# Patient Record
Sex: Female | Born: 1990 | Race: Black or African American | Hispanic: No | Marital: Single | Smoking: Never smoker
Health system: Southern US, Community
[De-identification: ages and names within clinical notes are randomized; demographics above are authoritative.]

## PROBLEM LIST (undated history)

## (undated) ENCOUNTER — Inpatient Hospital Stay (HOSPITAL_COMMUNITY): Payer: Self-pay

## (undated) DIAGNOSIS — B009 Herpesviral infection, unspecified: Secondary | ICD-10-CM

## (undated) DIAGNOSIS — N760 Acute vaginitis: Secondary | ICD-10-CM

## (undated) DIAGNOSIS — D649 Anemia, unspecified: Secondary | ICD-10-CM

## (undated) DIAGNOSIS — B999 Unspecified infectious disease: Secondary | ICD-10-CM

## (undated) DIAGNOSIS — F419 Anxiety disorder, unspecified: Secondary | ICD-10-CM

## (undated) DIAGNOSIS — N898 Other specified noninflammatory disorders of vagina: Principal | ICD-10-CM

## (undated) DIAGNOSIS — B9689 Other specified bacterial agents as the cause of diseases classified elsewhere: Secondary | ICD-10-CM

## (undated) DIAGNOSIS — A749 Chlamydial infection, unspecified: Secondary | ICD-10-CM

## (undated) DIAGNOSIS — Z889 Allergy status to unspecified drugs, medicaments and biological substances status: Secondary | ICD-10-CM

## (undated) DIAGNOSIS — L509 Urticaria, unspecified: Secondary | ICD-10-CM

## (undated) HISTORY — DX: Allergy status to unspecified drugs, medicaments and biological substances: Z88.9

## (undated) HISTORY — DX: Urticaria, unspecified: L50.9

## (undated) HISTORY — DX: Acute vaginitis: N76.0

## (undated) HISTORY — PX: NO PAST SURGERIES: SHX2092

## (undated) HISTORY — DX: Other specified noninflammatory disorders of vagina: N89.8

## (undated) HISTORY — DX: Herpesviral infection, unspecified: B00.9

## (undated) HISTORY — DX: Chlamydial infection, unspecified: A74.9

## (undated) HISTORY — DX: Other specified bacterial agents as the cause of diseases classified elsewhere: B96.89

---
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
## 2010-09-04 ENCOUNTER — Emergency Department (HOSPITAL_COMMUNITY)
Admission: EM | Admit: 2010-09-04 | Discharge: 2010-09-04 | Disposition: A | Discharge status: Home / Self Care | Payer: 59 | Attending: Emergency Medicine | Admitting: Emergency Medicine

## 2010-09-04 DIAGNOSIS — K5289 Other specified noninfective gastroenteritis and colitis: Secondary | ICD-10-CM | POA: Insufficient documentation

## 2014-01-13 ENCOUNTER — Emergency Department (HOSPITAL_COMMUNITY): Payer: BC Managed Care – PPO

## 2014-01-13 ENCOUNTER — Emergency Department (HOSPITAL_COMMUNITY)
Admission: EM | Admit: 2014-01-13 | Discharge: 2014-01-13 | Disposition: A | Discharge status: Home / Self Care | Payer: BC Managed Care – PPO | Attending: Emergency Medicine | Admitting: Emergency Medicine

## 2014-01-13 ENCOUNTER — Encounter (HOSPITAL_COMMUNITY): Payer: Self-pay | Admitting: Emergency Medicine

## 2014-01-13 DIAGNOSIS — Z3202 Encounter for pregnancy test, result negative: Secondary | ICD-10-CM | POA: Diagnosis not present

## 2014-01-13 DIAGNOSIS — R071 Chest pain on breathing: Secondary | ICD-10-CM | POA: Diagnosis not present

## 2014-01-13 DIAGNOSIS — R079 Chest pain, unspecified: Secondary | ICD-10-CM | POA: Diagnosis present

## 2014-01-13 DIAGNOSIS — R0789 Other chest pain: Secondary | ICD-10-CM

## 2014-01-13 LAB — BASIC METABOLIC PANEL
ANION GAP: 12 (ref 5–15)
BUN: 9 mg/dL (ref 6–23)
CO2: 25 meq/L (ref 19–32)
Calcium: 8.8 mg/dL (ref 8.4–10.5)
Chloride: 102 mEq/L (ref 96–112)
Creatinine, Ser: 0.67 mg/dL (ref 0.50–1.10)
GFR calc Af Amer: >90 mL/min (ref >90)
GFR calc non Af Amer: >90 mL/min (ref >90)
Glucose, Bld: 95 mg/dL (ref 70–99)
POTASSIUM: 3.8 meq/L (ref 3.7–5.3)
SODIUM: 139 meq/L (ref 137–147)

## 2014-01-13 LAB — URINALYSIS, ROUTINE W REFLEX MICROSCOPIC
Bilirubin Urine: NEGATIVE
Glucose, UA: NEGATIVE mg/dL
Ketones, ur: NEGATIVE mg/dL
LEUKOCYTES UA: NEGATIVE
Nitrite: NEGATIVE
Protein, ur: NEGATIVE mg/dL
Specific Gravity, Urine: <1.005 — ABNORMAL LOW (ref 1.005–1.030)
UROBILINOGEN UA: 0.2 mg/dL (ref 0.0–1.0)
pH: 6 (ref 5.0–8.0)

## 2014-01-13 LAB — LIPASE, BLOOD: Lipase: 28 U/L (ref 11–59)

## 2014-01-13 LAB — CBC
HCT: 35.3 % — ABNORMAL LOW (ref 36.0–46.0)
Hemoglobin: 11.8 g/dL — ABNORMAL LOW (ref 12.0–15.0)
MCH: 27.1 pg (ref 26.0–34.0)
MCHC: 33.4 g/dL (ref 30.0–36.0)
MCV: 81 fL (ref 78.0–100.0)
Platelets: 326 10*3/uL (ref 150–400)
RBC: 4.36 MIL/uL (ref 3.87–5.11)
RDW: 13.1 % (ref 11.5–15.5)
WBC: 11.4 10*3/uL — ABNORMAL HIGH (ref 4.0–10.5)

## 2014-01-13 LAB — HEPATIC FUNCTION PANEL
ALK PHOS: 59 U/L (ref 39–117)
ALT: 9 U/L (ref 0–35)
AST: 16 U/L (ref 0–37)
Albumin: 4 g/dL (ref 3.5–5.2)
Bilirubin, Direct: <0.2 mg/dL (ref 0.0–0.3)
TOTAL PROTEIN: 7.6 g/dL (ref 6.0–8.3)
Total Bilirubin: 0.5 mg/dL (ref 0.3–1.2)

## 2014-01-13 LAB — PREGNANCY, URINE: Preg Test, Ur: NEGATIVE

## 2014-01-13 LAB — URINE MICROSCOPIC-ADD ON

## 2014-01-13 LAB — TROPONIN I

## 2014-01-13 MED ORDER — METHOCARBAMOL 500 MG PO TABS: 1000.0000 mg | ORAL_TABLET | Freq: Four times a day (QID) | ORAL | Status: DC | PRN

## 2014-01-13 MED ORDER — HYDROCODONE-ACETAMINOPHEN 5-325 MG PO TABS: ORAL_TABLET | ORAL | Status: DC

## 2014-01-13 MED ORDER — IOHEXOL 350 MG/ML SOLN
100.0000 mL | Freq: Once | INTRAVENOUS | Status: AC | PRN
  Administered 2014-01-13: 100 mL via INTRAVENOUS

## 2014-01-13 MED ORDER — GI COCKTAIL ~~LOC~~
30.0000 mL | Freq: Once | ORAL | Status: AC
  Administered 2014-01-13: 30 mL via ORAL
  Filled 2014-01-13: qty 30

## 2014-01-13 MED ORDER — OXYCODONE-ACETAMINOPHEN 5-325 MG PO TABS: 1.0000 | ORAL_TABLET | Freq: Once | ORAL | Status: DC

## 2014-01-13 MED ORDER — IBUPROFEN 400 MG PO TABS
400.0000 mg | ORAL_TABLET | Freq: Once | ORAL | Status: AC
  Administered 2014-01-13: 400 mg via ORAL
  Filled 2014-01-13: qty 1

## 2014-01-13 MED ORDER — NAPROXEN 250 MG PO TABS: 250.0000 mg | ORAL_TABLET | Freq: Two times a day (BID) | ORAL | Status: DC

## 2014-01-13 NOTE — ED Notes (Signed)
Pt reports chest pain since last night. Pt reports pain is worse with a deep breath. Pt reports n/v. nad noted.

## 2014-01-13 NOTE — ED Notes (Signed)
Pt did not want anything to make her sleepy so refused percocet.

## 2014-01-13 NOTE — Discharge Instructions (Signed)
°Emergency Department Resource Guide °1) Find a Doctor and Pay Out of Pocket °Although you won't have to find out who is covered by your insurance plan, it is a good idea to ask around and get recommendations. You will then need to call the office and see if the doctor you have chosen will accept you as a new patient and what types of options they offer for patients who are self-pay. Some doctors offer discounts or will set up payment plans for their patients who do not have insurance, but you will need to ask so you aren't surprised when you get to your appointment. ° °2) Contact Your Local Health Department °Not all health departments have doctors that can see patients for sick visits, but many do, so it is worth a call to see if yours does. If you don't know where your local health department is, you can check in your phone book. The CDC also has a tool to help you locate your state's health department, and many state websites also have listings of all of their local health departments. ° °3) Find a Walk-in Clinic °If your illness is not likely to be very severe or complicated, you may want to try a walk in clinic. These are popping up all over the country in pharmacies, drugstores, and shopping centers. They're usually staffed by nurse practitioners or physician assistants that have been trained to treat common illnesses and complaints. They're usually fairly quick and inexpensive. However, if you have serious medical issues or chronic medical problems, these are probably not your best option. ° °No Primary Care Doctor: °- Call [facility] at  832-8000 - they can help you locate a primary care doctor that  accepts your insurance, provides certain services, etc. °- Physician Referral Service- 1-800-533-3463 ° °Chronic Pain Problems: °Organization         Address  Phone   Notes  °[facility]  (336) 297-2271 Patients need to be referred by their primary care doctor.  ° °Medication  Assistance: °Organization         Address  Phone   Notes  °[place] [facility] [address] °[place], [place] [address] (336) 641-8030 --Must be a resident of [place] °-- Must have NO insurance coverage whatsoever (no Medicaid/ Medicare, etc.) °-- The pt. MUST have a primary care doctor that directs their care regularly and follows them in the community °  °[facility]  (866) 331-1348   °[employer]  (888) 892-1162   ° °Agencies that provide inexpensive medical care: °Organization         Address  Phone   Notes  °[facility]  (336) 832-8035   °[facility]    (336) 832-7272   °[facility] [address] °[place], [place] [address] (336) 832-4777   °[facility] [address], °[place] (336) 271-4999   °[facility]    (336) 373-0678   °[facility]    (336) 272-1050   °[facility] ° [address], [place] Phone:  (336) 832-4444, Fax:  (336) 832-4440 Hours of Operation:  9 am - 6 pm, M-F.  Also accepts Medicaid/Medicare and self-pay.  °[facility] ° [address], [place] Phone: (336) 832-3150, Fax: (336) 832-3151. Hours of Operation:  8:30 am - 5:30 pm, M-F.  Also accepts Medicaid and self-pay.  °[facility] [place] [address], [place] Phone: (336) 878-6027   °[facility] [address], [place], [place] (336)723-1848, Ext. 123 Mondays & Thursdays: 7-9 AM.  First 15 patients are seen on a first come, first serve basis. °  ° °Medicaid-accepting [place] Providers: ° °Organization         Address  Phone   Notes  °[facility] [address], [place] (336) 641-2100 Also accepts self-pay patients.  °[facility] [address], [place] ° (336) 856-9996   °[facility] [address], [place]  (336) 288-8857   °[facility] [address] [place] [address], [place] (336) 299-7000   °Veita Bland [address], [place]  ° (336) 373-1557 Only accepts [place] Medicaid patients after they have their name applied to their card.  ° °Self-Pay (no insurance) in [place]: ° °Organization         Address  Phone   Notes  °Sickle Cell Patients, [facility] [address], [place] (336) 832-1970   °[facility] [address], [place] (336) 832-4400   °[facility] ° [address], [place] (336) 992-4800   °[facility]/Dr. Osei-Bonsu ° [address] [place] [address], [place] or [address], [place] (336) 841-8500 Phone number for both [place] and [place] locations is the same.  °[facility] [address], [place] (336) 299-0000   °[facility] [address] [place] [address], [place] or [address] (336) 852-7530 °(336) 878-2260   °[facility] [address], [place] (336) 350-1642, phone; (336) 294-5005, fax Sees patients 1st and 3rd Saturday of every month.  Must not qualify for public or private insurance (i.e. Medicaid, Medicare, [place] Health Choice, Veterans' Benefits) • Household income should be no more than 200% of the poverty level •The clinic cannot treat you if you are pregnant or think you are pregnant • Sexually transmitted diseases are not treated at the clinic.  ° ° °Dental Care: °Organization         Address  Phone  Notes  °[place] [facility] [address], [place] (336) 641-6152 Accepts children up to age 21 who are enrolled in Medicaid or [place] Health Choice; pregnant women with a Medicaid card; and children who have applied for Medicaid or [place] Health Choice, but were declined, whose parents can pay a reduced fee at time of service.  °[place]  [facility] [place]  [address], [place] (336) 641-7733 Accepts children up to age 21 who are enrolled in Medicaid or [place] Health Choice; pregnant women with a Medicaid card; and children who have applied for Medicaid or [place] Health Choice, but were declined, whose parents can pay a reduced fee at time of service.  °[facility] ° [address],  (336) 641-4533 Patients are seen by appointment only. Walk-ins are not accepted. [facility] will see patients 18 years of age and older. °Monday - Tuesday (8am-5pm) °Most Wednesdays (8:30-5pm) °$30 per visit, cash only  °[facility] ° [address], [place] (336) 641-4533 Patients are seen by appointment only. Walk-ins are not accepted. [facility] will see patients 18 years of age and older. °One   Wednesday Evening (Monthly: Volunteer Based).  $30 per visit, cash only  °[facility]  (919) 537-3737 for adults; Children under age 4, call Graduate Pediatric Dentistry at (919) [phone]. Children aged 4-14, please call (919) [phone] to request a pediatric application. ° Dental services are provided in all areas of dental care including fillings, crowns and bridges, complete and partial dentures, implants, gum treatment, root canals, and extractions. Preventive care is also provided. Treatment is provided to both adults and children. °Patients are selected via a lottery and there is often a waiting list. °  °[facility] [address], °[place] ° (336) 763-8833 [email or web address] °  °[facility] [address], [place], [place] (336)723-1848, Ext. 123 Second and Fourth Thursday of each month, opens at 6:30 AM; Clinic ends at 9 AM.  Patients are seen on a first-come first-served basis, and a limited number are seen during each clinic.  ° °[facility] ° [address], [place], [place] (336) 723-7904    Eligibility Requirements °You must have lived in [place], [place], or [place] counties for at least the last three months. °  You cannot be eligible for state or federal sponsored healthcare insurance, including Veterans Administration, Medicaid, or Medicare. °  You generally cannot be eligible for healthcare insurance through your employer.  °  How to apply: °Eligibility screenings are held every Tuesday and Wednesday afternoon from 1:00 pm until 4:00 pm. You do not need an appointment for the interview!  °[facility] [address], [place], [place] 336-631-2330   °[facility]  336-342-8273   °[facility]  336-703-3100   °[facility]  336-570-6415   ° °Behavioral Health Resources in the Community: °Intensive Outpatient Programs °Organization         Address  Phone  Notes  °[facility] [address], [place], [place] 336-878-6098   °[facility] [address], [place], [place] 336-832-9800   °[facility] [address], [place], [place] ° 336-882-2125   °[facility] [address],  °[place], [place] 1-800-853-5163 or 336-641-4981   °Substance Abuse Resources °Organization         Address  Phone  Notes  °[facility]  336-882-2125   °[facility]  336-784-9470   °[facility]  336-285-9073   °[facility]  336-845-3988   °Residential & Outpatient Substance Abuse Program  1-800-659-3381   °Psychological Services °Organization         Address  Phone  Notes  °[facility]  336- 832-9600   °[facility]  336- 378-7881   °[facility] [address], [place] 1-800-853-5163 or 336-641-4981   ° °Mobile Crisis Teams °Organization         Address  Phone  Notes  °[facility], Mobile Crisis Care Unit  1-877-626-1772   °Assertive °[facility] ° [address]  [place], [place] 336-834-9664   °Sharon DeEsch [address] °[place] 336-554-5454   ° °Self-Help/Support Groups °Organization         Address  Phone             Notes  °[facility] - variety of support groups  336- 373-1402 Call for more information  °Narcotics Anonymous (NA), [facility] [address], °[place]   2 meetings at this location  ° °  Residential Treatment Programs °Organization         Address  Phone  Notes  °[facility] [address],    °[place]  1-866-801-8205   °[facility] ° [address], [place], [place] 704-293-8524   °[facility] [address], [place] 336-845-3988 Admissions: 8am-3pm M-F  °[facility] [address]    °[place], [place] 336-841-1104   °[facility] [address], [place], [place] 336-379-7146   °[facility] [address]  °[place], [place] 336-285-9073   °[facility] - Intensive Outpatient [address], [place], [place] 336-852-3033   °[facility] [address]  °[place], [place] 1-877-615-2722 or 336-784-9470   °[facility] [address] [place], [place] 336-227-7417 Accepts Medicaid  °[facility] [address]  °[place] 1-800-659-3381 Substance Abuse/Addiction Treatment  ° °[place] Behavioral Health Resources °Organization         Address  Phone  Notes  °[facility]  (888) 581-9988   °Julie Brannon, PhD [address] [place], [place]   (336) 349-5553 or (336) 951-0000   °[facility]   [address] °[place], [place] (336) 349-4454   °[facility] [address], [place], [place] (336) 342-8316 Insurance/Medicaid/sponsorship through [facility]  °[facility] [address]                                    [place], [place] (336) 342-8316 Therapy/tele-psych/case    °[facility] [address]  ° [place], [place] (336) 349-2233    °Dr. Arfeen  (336) 349-4544   °[facility] [place]  [employer] [place] [facility] 1) [address], [place] °2) [address], [place] °3)  [address], [place] (336) 349-3220 °(336) 342-7768 ° °(336) 342-8140   °[place] [facility] (336) 342-1394 or (336) 342-3537 (After Hours)    ° ° °Take the prescriptions as directed.  Apply moist heat or ice to the area(s) of discomfort, for 15 minutes at a time, several times per day for the next few days.  Do not fall asleep on a heating or ice pack.  Call your regular medical doctor tomorrow to schedule a follow up appointment this week.  Return to the Emergency Department immediately if worsening. ° °

## 2014-01-13 NOTE — ED Provider Notes (Signed)
CSN: [id number]     Arrival date & time 01/13/14  4098 History   First MD Initiated Contact with Patient 01/13/14 1014     Chief Complaint  Patient presents with  . Chest Pain      HPI Pt was seen at 1100. Per pt, c/o gradual onset and persistence of constant lower mid-sternal chest "pain" that began yesterday. Pt describes the pain as "constant pressure" which worsens with "taking a deep breath and talking." Has been associated with SOB. Pt states she "coughs and vomit comes up." Denies abd pain, no diarrhea, no back pain, no palpitations, no fevers, no recent illness, no rash.    History reviewed. No pertinent past medical history.  History reviewed. No pertinent past surgical history.  History  Substance Use Topics  . Smoking status: Never Smoker   . Smokeless tobacco: Not on file  . Alcohol Use: No    Review of Systems ROS: Statement: All systems negative except as marked or noted in the HPI; Constitutional: Negative for fever and chills. ; ; Eyes: Negative for eye pain, redness and discharge. ; ; ENMT: Negative for ear pain, hoarseness, nasal congestion, sinus pressure and sore throat. ; ; Cardiovascular: +CP, SOB. Negative for palpitations, diaphoresis, and peripheral edema. ; ; Respiratory: Negative for cough, wheezing and stridor. ; ; Gastrointestinal: +N/V. Negative for diarrhea, abdominal pain, blood in stool, hematemesis, jaundice and rectal bleeding. . ; ; Genitourinary: Negative for dysuria, flank pain and hematuria. ; ; Musculoskeletal: Negative for back pain and neck pain. Negative for swelling and trauma.; ; Skin: Negative for pruritus, rash, abrasions, blisters, bruising and skin lesion.; ; Neuro: Negative for headache, lightheadedness and neck stiffness. Negative for weakness, altered level of consciousness , altered mental status, extremity weakness, paresthesias, involuntary movement, seizure and syncope.      Allergies  Review of patient's allergies indicates no  known allergies.  Home Medications   Prior to Admission medications   Medication Sig Start Date End Date Taking? Authorizing Provider  ibuprofen (ADVIL,MOTRIN) 200 MG tablet Take 400 mg by mouth every 6 (six) hours as needed for moderate pain or cramping.   Yes Historical Provider, MD   BP 127/72  Pulse 80  Temp(Src) 98.9 F (37.2 C) (Oral)  Resp 14  Ht  (1.676 m)  Wt 138 lb (62.596 kg)  BMI 22.28 kg/m2  SpO2 100%  LMP 12/30/2013 Physical Exam 1105: Physical examination:  Nursing notes reviewed; Vital signs and O2 SAT reviewed;  Constitutional: Well developed, Well nourished, Well hydrated, In no acute distress; Head:  Normocephalic, atraumatic; Eyes: EOMI, PERRL, No scleral icterus; ENMT: Mouth and pharynx normal, Mucous membranes moist; Neck: Supple, Full range of motion, No lymphadenopathy; Cardiovascular: Regular rate and rhythm, No murmur, rub, or gallop; Respiratory: Breath sounds clear & equal bilaterally, No rales, rhonchi, wheezes.  Speaking full sentences with ease, Normal respiratory effort/excursion; Chest: +anterior chest wall tender to palp. No rash, no soft tissue crepitus, no deformity. Movement normal; Abdomen: Soft, Nontender, Nondistended, Normal bowel sounds; Genitourinary: No CVA tenderness; Extremities: Pulses normal, No tenderness, No edema, No calf edema or asymmetry.; Neuro: AA&Ox3, Major CN grossly intact.  Speech clear. No gross focal motor or sensory deficits in extremities.; Skin: Color normal, Warm, Dry.   ED Course  Procedures     EKG Interpretation   Date/Time:  Monday January 13 2014 09:28:00 EDT Ventricular Rate:  75 PR Interval:  136 QRS Duration: 78 QT Interval:  378 QTC Calculation: 422 R Axis:  77 Text Interpretation:  Normal sinus rhythm with sinus arrhythmia Normal ECG  No old tracing to compare Confirmed by [facility]  MD, Nicholos Johns [phone]) on  01/13/2014 10:14:37 AM      MDM  MDM Reviewed: previous chart, nursing note and  vitals Reviewed previous: labs and ECG Interpretation: labs, ECG, x-ray and CT scan    Results for orders placed during the hospital encounter of 01/13/14  CBC      Result Value Ref Range   WBC 11.4 (*) 4.0 - 10.5 K/uL   RBC 4.36  3.87 - 5.11 MIL/uL   Hemoglobin 11.8 (*) 12.0 - 15.0 g/dL   HCT 60.4 (*) 54.0 - 98.1 %   MCV 81.0  78.0 - 100.0 fL   MCH 27.1  26.0 - 34.0 pg   MCHC 33.4  30.0 - 36.0 g/dL   RDW 19.1  47.8 - 29.5 %   Platelets 326  150 - 400 K/uL  BASIC METABOLIC PANEL      Result Value Ref Range   Sodium 139  137 - 147 mEq/L   Potassium 3.8  3.7 - 5.3 mEq/L   Chloride 102  96 - 112 mEq/L   CO2 25  19 - 32 mEq/L   Glucose, Bld 95  70 - 99 mg/dL   BUN 9  6 - 23 mg/dL   Creatinine, Ser 6.21  0.50 - 1.10 mg/dL   Calcium 8.8  8.4 - 30.8 mg/dL   GFR calc non Af Amer >90  >90 mL/min   GFR calc Af Amer >90  >90 mL/min   Anion gap 12  5 - 15  TROPONIN I      Result Value Ref Range   Troponin I <0.30  <0.30 ng/mL  PREGNANCY, URINE      Result Value Ref Range   Preg Test, Ur NEGATIVE  NEGATIVE  URINALYSIS, ROUTINE W REFLEX MICROSCOPIC      Result Value Ref Range   Color, Urine YELLOW  YELLOW   APPearance CLEAR  CLEAR   Specific Gravity, Urine <1.005 (*) 1.005 - 1.030   pH 6.0  5.0 - 8.0   Glucose, UA NEGATIVE  NEGATIVE mg/dL   Hgb urine dipstick TRACE (*) NEGATIVE   Bilirubin Urine NEGATIVE  NEGATIVE   Ketones, ur NEGATIVE  NEGATIVE mg/dL   Protein, ur NEGATIVE  NEGATIVE mg/dL   Urobilinogen, UA 0.2  0.0 - 1.0 mg/dL   Nitrite NEGATIVE  NEGATIVE   Leukocytes, UA NEGATIVE  NEGATIVE  URINE MICROSCOPIC-ADD ON      Result Value Ref Range   Squamous Epithelial / LPF RARE  RARE   WBC, UA 0-2  <3 WBC/hpf   RBC / HPF 0-2  <3 RBC/hpf  LIPASE, BLOOD      Result Value Ref Range   Lipase 28  11 - 59 U/L  HEPATIC FUNCTION PANEL      Result Value Ref Range   Total Protein 7.6  6.0 - 8.3 g/dL   Albumin 4.0  3.5 - 5.2 g/dL   AST 16  0 - 37 U/L   ALT 9  0 - 35 U/L    Alkaline Phosphatase 59  39 - 117 U/L   Total Bilirubin 0.5  0.3 - 1.2 mg/dL   Bilirubin, Direct <6.5  0.0 - 0.3 mg/dL   Indirect Bilirubin NOT CALCULATED  0.3 - 0.9 mg/dL   Dg Chest 2 View 11/14/4694   CLINICAL DATA:  Chest pain  EXAM: CHEST  2 VIEW  COMPARISON:  None.  FINDINGS: Mildly enlarged cardiopericardial silhouette, cardiothoracic index 51%. No edema. The lungs appear clear. No pleural effusion.  IMPRESSION: 1. Mildly enlarged cardiopericardial silhouette but otherwise normal exam. Correlate with cardiac auscultation and ECG in determining the need for echocardiography.   Electronically Signed   By: Herbie Baltimore M.D.   On: 01/13/2014 10:29   Ct Angio Chest Pe W/cm &/or Wo Cm 01/13/2014   CLINICAL DATA:  Chest pain.  EXAM: CT ANGIOGRAPHY CHEST WITH CONTRAST  TECHNIQUE: Multidetector CT imaging of the chest was performed using the standard protocol during bolus administration of intravenous contrast. Multiplanar CT image reconstructions and MIPs were obtained to evaluate the vascular anatomy.  CONTRAST:  OMNIPAQUE IOHEXOL 350 MG/ML SOLN  COMPARISON:  Chest x-ray earlier today.  FINDINGS: The pulmonary arteries are adequately opacified. There is no evidence of pulmonary embolism. Lungs show no evidence of edema, infiltrate or nodule. No pleural or pericardial fluid is identified. No pneumothorax or pneumomediastinum. The visualized airways are normally patent.  No lymphadenopathy identified. The heart size is normal. The thoracic aorta is of normal caliber. The bony thorax is unremarkable. Visualized upper abdominal structures are within normal limits.  Review of the MIP images confirms the above findings.  IMPRESSION: Normal CTA of the chest. No evidence of pulmonary embolism or other acute findings.   Electronically Signed   By: Irish Lack M.D.   On: 01/13/2014 12:03    1315:  Normal heart size on CT scan. Testing reassuring. Will tx symptomatically at this time. Doubt PE as cause for  symptoms with normal CT-A chest.  Doubt ACS as cause for symptoms with normal troponin and unchanged EKG from previous after 2 days of constant symptoms. Pt states she wants to go home now. Pt is requesting a work note.  Dx and testing d/w pt and family.  Questions answered.  Verb understanding, agreeable to d/c home with outpt f/u.    Samuel Jester, DO 01/16/14 0003

## 2014-02-04 ENCOUNTER — Emergency Department (INDEPENDENT_AMBULATORY_CARE_PROVIDER_SITE_OTHER)
Admission: EM | Admit: 2014-02-04 | Discharge: 2014-02-04 | Disposition: A | Discharge status: Home / Self Care | Payer: BC Managed Care – PPO | Source: Home / Self Care | Attending: Family Medicine | Admitting: Family Medicine

## 2014-02-04 ENCOUNTER — Encounter (HOSPITAL_COMMUNITY): Payer: Self-pay | Admitting: Emergency Medicine

## 2014-02-04 DIAGNOSIS — L509 Urticaria, unspecified: Secondary | ICD-10-CM

## 2014-02-04 MED ORDER — PREDNISONE 5 MG PO KIT: PACK | ORAL | Status: DC

## 2014-02-04 NOTE — Discharge Instructions (Signed)
Thank you for coming in today. Take Zyrtec daily. You can also take Benadryl as needed. Take prednisone as directed for 12 days. Followup with a primary care provider.  Call or go to the emergency room if you get worse, have trouble breathing, have chest pains, or palpitations.  PRIMARY CARE Merchant navy officerDOCTORS [facility] [address]  [place], [place] [phone]  Fax [phone]  Nature conservation [facility] at [facility] [address]  [place], [place] [phone]  Fax [phone]  Nature conservation officer[facility]Bauer [facility] at [place] / [place] [id number]  [place], [place] [phone]  Fax [phone]  [facility] [address], [address]  [place], [place] [phone]  Fax [phone]  [facility] [address]  [place], [place] [phone]  Fax [phone]  [facility] [address] [place]  [place], [place] [phone]  Fax [phone]   [facility] [place] [address] [place] [address] Phone: [phone]   [facility] @ [facility] [address] [place] [address] Phone: [phone]   [facility] @ [place] [address] [place] [address] Phone: [phone]   [facility] @ [place] [address] [place] [address] Phone: [phone]   [facility] @ [place] [address] [place] [address] Phone: [phone] Fax: [phone]   [facility] @ [place] [address] [place] [address] Phone: [phone]   Dr. Maryelizabeth RowanElizabeth Dewey [address] [place] [address] [phone]   Hives Hives are itchy, red, swollen areas of the skin. They can vary in size and location on your body. Hives can  come and go for hours or several days (acute hives) or for several weeks (chronic hives). Hives do not spread from person to person (noncontagious). They may get worse with scratching, exercise, and emotional stress. CAUSES   Allergic reaction to food, additives, or drugs.  Infections, including the common cold.  Illness, such as vasculitis, lupus, or thyroid disease.  Exposure to sunlight, heat, or cold.  Exercise.  Stress.  Contact with chemicals. SYMPTOMS   Red or white swollen patches on the skin. The patches may change size, shape, and location quickly and repeatedly.  Itching.  Swelling of the hands, feet, and face. This may occur if hives develop deeper in the skin. DIAGNOSIS  Your caregiver can usually tell what is wrong by performing a physical exam. Skin or blood tests may also be done to determine the cause of your hives. In some cases, the cause cannot be determined. TREATMENT  Mild cases usually get better with medicines such as antihistamines. Severe cases may require an emergency epinephrine injection. If the cause of your hives is known, treatment includes avoiding that trigger.  HOME CARE INSTRUCTIONS   Avoid causes that trigger your hives.  Take antihistamines as directed by your caregiver to reduce the severity of your hives. Non-sedating or low-sedating antihistamines are usually recommended. Do not drive while taking an antihistamine.  Take any other medicines prescribed for itching as directed by your caregiver.  Wear loose-fitting clothing.  Keep all follow-up appointments as directed by your caregiver. SEEK MEDICAL CARE IF:   You have persistent or severe itching that  is not relieved with medicine.  You have painful or swollen joints. SEEK IMMEDIATE MEDICAL CARE IF:   You have a fever.  Your tongue or lips are swollen.  You have trouble breathing or swallowing.  You feel tightness in the throat or chest.  You have abdominal pain. These  problems may be the first sign of a life-threatening allergic reaction. Call your local emergency services (911 in [place].). MAKE SURE YOU:   Understand these instructions.  Will watch your condition.  Will get help right away if you are not doing well or get worse. Document Released: 04/25/2005 Document Revised: 04/30/2013 Document Reviewed: 07/19/2011 [facility] Patient Information 2015 [place], [place]. This information is not intended to replace advice given to you by your health care provider. Make sure you discuss any questions you have with your health care provider.

## 2014-02-04 NOTE — ED Provider Notes (Signed)
Gail Johnson is a 23 y.o. female who presents to Urgent Care today for hives. Patient has had urticaria present for about one month. This has happened in the past. She cannot recall any new changes to food or detergents or shampoos. She notes a month ago she was given some Robaxin which seemed to cause hives however she has not taken that medication over one month. Today she noted that her throat was somewhat itchy and scratchy and she comes to clinic today for evaluation. She no longer notes any throat sensations and denies any tongue or lip swelling. She takes Benadryl for hives which seemed to help some. No fevers or chills nausea vomiting or diarrhea appear   History reviewed. No pertinent past medical history. History  Substance Use Topics  . Smoking status: Never Smoker   . Smokeless tobacco: Not on file  . Alcohol Use: No   ROS as above Medications: No current facility-administered medications for this encounter.   Current Outpatient Prescriptions  Medication Sig Dispense Refill  . PredniSONE 5 MG KIT 12 day Dosepak by mouth  1 kit  0    Exam:  BP 116/78  Pulse 78  Temp(Src) 99 F (37.2 C) (Oral)  Resp 18  SpO2 98%  LMP 02/02/2014 Gen: Well NAD HEENT: EOMI,  MMM no tongue or lip swelling. No oral lesions. Lungs: Normal work of breathing. CTABL Heart: RRR no MRG Abd: NABS, Soft. Nondistended, Nontender Exts: Brisk capillary refill, warm and well perfused.  Skin: Scattered urticaria across face trunk and extremities  No results found for this or any previous visit (from the past 24 hour(s)). No results found.  Assessment and Plan: 23 y.o. female with urticaria. Unclear etiology. Plan to treat with prednisone Dosepak, and Zyrtec and Benadryl. Followup with the PCP. Consider referral to allergy/immunology.  Discussed warning signs or symptoms. Please see discharge instructions. Patient expresses understanding.     Gregor Hams, MD 02/04/14 [phone]

## 2014-02-04 NOTE — ED Notes (Signed)
Pt triaged and assessed by provider.   Provider in before nurse. 

## 2014-05-09 NOTE — L&D Delivery Note (Signed)
Delivery Note At 5:32 AM a viable female was delivered via  (Presentation: ROA).  APGAR: per nursing documentation; weight  pending.   Placenta status: intact, complete.  Cord: 2 vessel with the following complications: none.   Anesthesia:  epidural Episiotomy:  none Lacerations:  none Est. Blood Loss (mL):  501  Mom to postpartum.  Baby to Couplet care / Skin to Skin.  Lowanda Foster 12/15/2014, 5:53 AM

## 2014-05-26 ENCOUNTER — Other Ambulatory Visit: Payer: Self-pay | Admitting: Obstetrics and Gynecology

## 2014-05-26 DIAGNOSIS — O3680X0 Pregnancy with inconclusive fetal viability, not applicable or unspecified: Secondary | ICD-10-CM

## 2014-05-28 ENCOUNTER — Other Ambulatory Visit: Payer: Self-pay | Admitting: Obstetrics and Gynecology

## 2014-05-28 ENCOUNTER — Ambulatory Visit (INDEPENDENT_AMBULATORY_CARE_PROVIDER_SITE_OTHER): Payer: BLUE CROSS/BLUE SHIELD

## 2014-05-28 ENCOUNTER — Encounter: Payer: Self-pay | Admitting: Obstetrics and Gynecology

## 2014-05-28 DIAGNOSIS — Z36 Encounter for antenatal screening of mother: Secondary | ICD-10-CM

## 2014-05-28 DIAGNOSIS — Z3682 Encounter for antenatal screening for nuchal translucency: Secondary | ICD-10-CM

## 2014-05-28 DIAGNOSIS — O3680X0 Pregnancy with inconclusive fetal viability, not applicable or unspecified: Secondary | ICD-10-CM

## 2014-05-28 NOTE — Progress Notes (Signed)
U/S-single IUP with +FCA noted, FHR-162 bpm, CRL c/w 12+3wks EDD 12/07/2014, cx appears closed, posterior Gr 0 placenta (previa noted), Pt desires NT/IT screen, NB present, NT-1.5231mm

## 2014-05-30 LAB — US OB COMP LESS 14 WKS

## 2014-06-05 ENCOUNTER — Ambulatory Visit (INDEPENDENT_AMBULATORY_CARE_PROVIDER_SITE_OTHER): Payer: BLUE CROSS/BLUE SHIELD | Admitting: Advanced Practice Midwife

## 2014-06-05 ENCOUNTER — Other Ambulatory Visit (HOSPITAL_COMMUNITY)
Admission: RE | Admit: 2014-06-05 | Discharge: 2014-06-05 | Disposition: A | Discharge status: Home / Self Care | Payer: BLUE CROSS/BLUE SHIELD | Source: Ambulatory Visit | Attending: Advanced Practice Midwife | Admitting: Advanced Practice Midwife

## 2014-06-05 ENCOUNTER — Encounter: Payer: Self-pay | Admitting: Advanced Practice Midwife

## 2014-06-05 VITALS — BP 120/76 | Wt 144.5 lb

## 2014-06-05 DIAGNOSIS — B009 Herpesviral infection, unspecified: Secondary | ICD-10-CM | POA: Insufficient documentation

## 2014-06-05 DIAGNOSIS — Z01411 Encounter for gynecological examination (general) (routine) with abnormal findings: Secondary | ICD-10-CM | POA: Insufficient documentation

## 2014-06-05 DIAGNOSIS — Z1151 Encounter for screening for human papillomavirus (HPV): Secondary | ICD-10-CM | POA: Insufficient documentation

## 2014-06-05 DIAGNOSIS — Z889 Allergy status to unspecified drugs, medicaments and biological substances status: Secondary | ICD-10-CM | POA: Insufficient documentation

## 2014-06-05 DIAGNOSIS — R87610 Atypical squamous cells of undetermined significance on cytologic smear of cervix (ASC-US): Secondary | ICD-10-CM

## 2014-06-05 DIAGNOSIS — Z1159 Encounter for screening for other viral diseases: Secondary | ICD-10-CM

## 2014-06-05 DIAGNOSIS — Z130 Encounter for screening for diseases of the blood and blood-forming organs and certain disorders involving the immune mechanism: Secondary | ICD-10-CM

## 2014-06-05 DIAGNOSIS — Z23 Encounter for immunization: Secondary | ICD-10-CM

## 2014-06-05 DIAGNOSIS — Z1389 Encounter for screening for other disorder: Secondary | ICD-10-CM

## 2014-06-05 DIAGNOSIS — Z118 Encounter for screening for other infectious and parasitic diseases: Secondary | ICD-10-CM

## 2014-06-05 DIAGNOSIS — Z114 Encounter for screening for human immunodeficiency virus [HIV]: Secondary | ICD-10-CM

## 2014-06-05 DIAGNOSIS — Z0184 Encounter for antibody response examination: Secondary | ICD-10-CM

## 2014-06-05 DIAGNOSIS — Z0283 Encounter for blood-alcohol and blood-drug test: Secondary | ICD-10-CM

## 2014-06-05 DIAGNOSIS — Z331 Pregnant state, incidental: Secondary | ICD-10-CM

## 2014-06-05 DIAGNOSIS — Z3400 Encounter for supervision of normal first pregnancy, unspecified trimester: Secondary | ICD-10-CM | POA: Insufficient documentation

## 2014-06-05 DIAGNOSIS — Z113 Encounter for screening for infections with a predominantly sexual mode of transmission: Secondary | ICD-10-CM

## 2014-06-05 DIAGNOSIS — Z3401 Encounter for supervision of normal first pregnancy, first trimester: Secondary | ICD-10-CM

## 2014-06-05 LAB — POCT URINALYSIS DIPSTICK
Blood, UA: NEGATIVE
GLUCOSE UA: NEGATIVE
KETONES UA: NEGATIVE
LEUKOCYTES UA: NEGATIVE
Nitrite, UA: NEGATIVE
Protein, UA: NEGATIVE

## 2014-06-05 LAB — OB RESULTS CONSOLE RUBELLA ANTIBODY, IGM: Rubella: IMMUNE

## 2014-06-05 LAB — OB RESULTS CONSOLE ABO/RH: RH Type: POSITIVE

## 2014-06-05 NOTE — Progress Notes (Signed)
  Subjective:    Gail HertzJasmine D Johnson is a G1P0 5629w4d being seen today for her first obstetrical visit.  Her obstetrical history is significant for nothing.  Pregnancy history fully reviewed.  Patient reports no complaints. 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  Wasn't wanting to be pregnant--family doesn't really like FOB and wanted her to finish college.  Plans to drive to [place] this weekend to tell her grandmother. Has already told her father, good support.  Filed Vitals:   06/05/14 1100  BP: 120/76  Weight: 144 lb 8 oz (65.545 kg)    HISTORY: OB History  Gravida Para Term Preterm AB SAB TAB Ectopic Multiple Living  1             # Outcome Date GA Lbr Len/2nd Weight Sex Delivery Anes PTL Lv  1 Current              Past Medical History  Diagnosis Date  . HSV-2 infection    Past Surgical History  Procedure Laterality Date  . No past surgeries     Family History  Problem Relation Age of Onset  . Hypertension Maternal Grandmother   . Heart disease Maternal Grandfather   . Hypertension Paternal Grandmother   . Diabetes Paternal Grandmother      Exam       Pelvic Exam:    Perineum: Normal Perineum   Vulva: normal   Vagina:  normal mucosa, normal discharge, no palpable nodules   Uterus Normal, Gravid, FH: 14     Cervix: Normal.  Pap collected   Adnexa: Not palpable   Urinary:  urethral meatus normal    System: Breast:  normal appearance, no masses or tenderness   Skin: normal coloration and turgor, no rashes    Neurologic: oriented, normal, normal mood   Extremities: normal strength, tone, and muscle mass   HEENT PERRLA   Mouth/Teeth mucous membranes moist, normal dentition   Neck supple and no masses   Cardiovascular: regular rate and rhythm   Respiratory:  appears well, vitals normal, no respiratory distress, acyanotic   Abdomen: soft, non-tender;  FHR: 150          Assessment:    Pregnancy: G1P0 Patient Active Problem List   Diagnosis Date  Noted  . Supervision of normal first pregnancy 06/05/2014  . HSV-2 infection         Plan:     Initial labs drawn. Continue prenatal vitamins  Problem list reviewed and updated  Reviewed n/v relief measures and warning s/s to report  Reviewed recommended weight gain based on pre-gravid BMI  Encouraged well-balanced diet Genetic Screening discussed Integrated Screen: requested.  Ultrasound discussed; fetal survey: requested.  Follow up in 3 weeks for ovb visit and 2nd IT.  CRESENZO-DISHMAN,Eleni Frank 06/05/2014

## 2014-06-05 NOTE — Progress Notes (Signed)
Pt given CCNC form and lab consents to read over and sign. Pt denies any problems or concerns at this time.  

## 2014-06-05 NOTE — Patient Instructions (Signed)
How a Baby Grows During Pregnancy Pregnancy begins when the female's sperm enters the female's egg. This happens in the fallopian tube and is called fertilization. The fertilized egg is called an embryo until it reaches 9 weeks from the time of fertilization. From 9 weeks until birth it is called a fetus. The fertilized egg moves down the tube into the uterus and attaches to the inside lining of the uterus.  The pregnant woman is responsible for the growth of the embryo/fetus by supplying nourishment and oxygen through the blood stream and placenta to the developing fetus. The uterus becomes larger and pops out from the abdomen more and more as the fetus develops and grows. A normal pregnancy lasts 280 days, with a range of 259 to 294 days, or 40 weeks. The pregnancy is divided up into three trimesters:  First trimester - 0 to 13 weeks.  Second trimester - 14 to 27 weeks.  Third trimester - 28 to 40 weeks. The day your baby is supposed to be born is called estimated date of confinement (EDC) or estimated date of delivery (EDD). GROWTH OF THE BABY MONTH BY MONTH 1. First Month: The fertilized egg attaches to the inside of the uterus and certain cells will form the placenta and others will develop into the fetus. The arms, legs, brain, spinal cord, lungs, and heart begin to develop. At the end of the first month the heart begins to beat. The embryo weighs less than an ounce and is  inch long. 2. Second Month: The bones can be seen, the inner ear, eye lids, hands and feet form and genitals develop. By the end of 8 weeks, all of the major organs are developing. The fetus now weighs less than an ounce and is one inch (2.54 cm) long. 3. Third Month: Teeth buds appear, all the internal organs are forming, bones and muscles begin to grow, the spine can flex and the skin is transparent. Finger and toe nails begin to form, the hands develop faster than the feet and the arms are longer than the legs at this point.  The fetus weighs a little more than an ounce (0.03 kg) and is 3 inches (8.89cm) long. 4. Fourth Month: The placenta is completely formed. The external sex organs, neck, outer ear, eyebrows, eyelids and fingernails are formed. The fetus can hear, swallow, flex its arms and legs and the kidney begins to produce urine. The skin is covered with a white waxy coating (vernix) and very thin hair (lanugo) is present. The fetus weighs 5 ounces (0.14kg) and is 6 to 7 inches (16.51cm) long. 5. Fifth Month: The fetus moves around more and can be felt for the first time (called quickening), sleeps and wakes up at times, may begin to suck its finger and the nails grow to the end of the fingers. The gallbladder is now functioning and helps to digest the nutrients, eggs are formed in the female and the testicles begin to drop down from the abdomen to the scrotum in the female. The fetus weighs  to 1 pound (0.45kg) and is 10 inches (25.4cm) long. 6. Sixth Month: The lungs are formed but the fetus does not breath yet. The eyes open, the brain develops more quickly at this time, one can detect finger and toe prints and thicker hair grows. The fetus weighs 1 to 1 pounds (0.68kg) and is 12 inches (30.48cm) long. 7. Seventh Month: The fetus can hear and respond to sounds, kicks and stretches and can sense   changes in light. The fetus weighs 2 to 2 pounds (1.13kg) and is 14 inches (35.56cm) long. 8. Eight Month: All organs and body systems are fully developed and functioning. The bones get harder, taste buds develop and can taste sweet and sour flavors and the fetus may hiccup now. Different parts of the brain are developing and the skull remains soft for the brain to grow. The fetus weighs 5 pounds (2.27kg) and is 18 inches (45.75cm) long. 9. Ninth Month: The fetus gains about a half a pound a week, the lungs are fully developed, patterns of sleep develop and the head moves down into the bottom of the uterus called vertex. If the  buttocks moves into the bottom of the uterus, it is called a breech. The fetus weighs 6 to 9 pounds (2.72 to 4.08kg) and is 20 inches (50.8cm) long. You should be informed about your pregnancy, yourself and how the baby is developing as much as possible. Being informed helps you to enjoy this experience. It also gives you the sense to feel if something is not going right and when to ask questions. Talk to your caregiver when you have questions about your baby or your own body. Document Released: 10/12/2007 Document Revised: 07/18/2011 Document Reviewed: 10/12/2007 ExitCare Patient Information 2015 [employer]. This information is not intended to replace advice given to you by your health care provider. Make sure you discuss any questions you have with your health care provider.  

## 2014-06-07 LAB — URINE CULTURE: ORGANISM ID, BACTERIA: NO GROWTH

## 2014-06-09 ENCOUNTER — Other Ambulatory Visit: Payer: Self-pay | Admitting: Advanced Practice Midwife

## 2014-06-09 DIAGNOSIS — A749 Chlamydial infection, unspecified: Secondary | ICD-10-CM | POA: Insufficient documentation

## 2014-06-09 LAB — CBC
HEMATOCRIT: 36.1 % (ref 34.0–46.6)
Hemoglobin: 11.9 g/dL (ref 11.1–15.9)
MCH: 26.3 pg — AB (ref 26.6–33.0)
MCHC: 33 g/dL (ref 31.5–35.7)
MCV: 80 fL (ref 79–97)
Platelets: 307 10*3/uL (ref 150–379)
RBC: 4.52 x10E6/uL (ref 3.77–5.28)
RDW: 14.4 % (ref 12.3–15.4)
WBC: 10.9 10*3/uL — AB (ref 3.4–10.8)

## 2014-06-09 LAB — URINALYSIS, ROUTINE W REFLEX MICROSCOPIC
BILIRUBIN UA: NEGATIVE
GLUCOSE, UA: NEGATIVE
KETONES UA: NEGATIVE
Leukocytes, UA: NEGATIVE
NITRITE UA: NEGATIVE
RBC, UA: NEGATIVE
SPEC GRAV UA: 1.024 (ref 1.005–1.030)
Urobilinogen, Ur: 1 mg/dL (ref 0.2–1.0)
pH, UA: 7 (ref 5.0–7.5)

## 2014-06-09 LAB — HIV ANTIBODY (ROUTINE TESTING W REFLEX): HIV Screen 4th Generation wRfx: NONREACTIVE

## 2014-06-09 LAB — RUBELLA SCREEN: RUBELLA: 2.08 {index} (ref >0.99)

## 2014-06-09 LAB — HEPATITIS B SURFACE ANTIGEN: Hepatitis B Surface Ag: NEGATIVE

## 2014-06-09 LAB — PMP SCREEN PROFILE (10S), URINE
AMPHETAMINE SCRN UR: NEGATIVE ng/mL
Barbiturate Screen, Ur: NEGATIVE ng/mL
Benzodiazepine Screen, Urine: NEGATIVE ng/mL
CANNABINOIDS UR QL SCN: NEGATIVE ng/mL
COCAINE(METAB.) SCREEN, URINE: NEGATIVE ng/mL
Creatinine(Crt), U: 217.5 mg/dL (ref 20.0–300.0)
Methadone Scn, Ur: NEGATIVE ng/mL
OXYCODONE+OXYMORPHONE UR QL SCN: NEGATIVE ng/mL
Opiate Scrn, Ur: NEGATIVE ng/mL
PCP SCRN UR: NEGATIVE ng/mL
PH UR, DRUG SCRN: 7.1 (ref 4.5–8.9)
PROPOXYPHENE SCREEN: NEGATIVE ng/mL

## 2014-06-09 LAB — GC/CHLAMYDIA PROBE AMP
Chlamydia trachomatis, NAA: POSITIVE — AB
Neisseria gonorrhoeae by PCR: NEGATIVE

## 2014-06-09 LAB — SICKLE CELL SCREEN: Sickle Cell Prep: NEGATIVE

## 2014-06-09 LAB — ANTIBODY SCREEN: Antibody Screen: NEGATIVE

## 2014-06-09 LAB — RPR: RPR Ser Ql: NONREACTIVE

## 2014-06-09 LAB — ABO/RH: RH TYPE: POSITIVE

## 2014-06-09 LAB — CYTOLOGY - PAP

## 2014-06-09 MED ORDER — AZITHROMYCIN 500 MG PO TABS: 500.0000 mg | ORAL_TABLET | Freq: Every day | ORAL | Status: DC

## 2014-06-09 NOTE — Progress Notes (Signed)
+   CHL.  Pt did not answer phone.  Left message to call [facility] to go over test results  Azithromycin 1gm for pt and partner. POC 3-4 weeks

## 2014-06-10 ENCOUNTER — Telehealth: Payer: Self-pay | Admitting: *Deleted

## 2014-06-10 MED ORDER — AZITHROMYCIN 500 MG PO TABS: 1000.0000 mg | ORAL_TABLET | Freq: Once | ORAL | Status: DC

## 2014-06-10 NOTE — Progress Notes (Signed)
Pt called back and notified of ? + CHL

## 2014-06-12 ENCOUNTER — Telehealth: Payer: Self-pay | Admitting: Advanced Practice Midwife

## 2014-06-12 ENCOUNTER — Other Ambulatory Visit: Payer: Self-pay | Admitting: Advanced Practice Midwife

## 2014-06-12 DIAGNOSIS — R87610 Atypical squamous cells of undetermined significance on cytologic smear of cervix (ASC-US): Secondary | ICD-10-CM | POA: Insufficient documentation

## 2014-06-12 DIAGNOSIS — A749 Chlamydial infection, unspecified: Secondary | ICD-10-CM

## 2014-06-12 DIAGNOSIS — Z3402 Encounter for supervision of normal first pregnancy, second trimester: Secondary | ICD-10-CM

## 2014-06-12 MED ORDER — AZITHROMYCIN 500 MG PO TABS: 1000.0000 mg | ORAL_TABLET | Freq: Once | ORAL | Status: DC

## 2014-06-16 NOTE — Telephone Encounter (Signed)
Pt states that she has already spoken with Drenda FreezeFran about the problem and everything had been taken care of.

## 2014-06-25 ENCOUNTER — Ambulatory Visit (INDEPENDENT_AMBULATORY_CARE_PROVIDER_SITE_OTHER): Payer: BLUE CROSS/BLUE SHIELD | Admitting: Women's Health

## 2014-06-25 ENCOUNTER — Encounter: Payer: Self-pay | Admitting: Women's Health

## 2014-06-25 VITALS — BP 108/58 | Wt 145.0 lb

## 2014-06-25 DIAGNOSIS — Z3402 Encounter for supervision of normal first pregnancy, second trimester: Secondary | ICD-10-CM

## 2014-06-25 DIAGNOSIS — Z1389 Encounter for screening for other disorder: Secondary | ICD-10-CM

## 2014-06-25 DIAGNOSIS — Z363 Encounter for antenatal screening for malformations: Secondary | ICD-10-CM

## 2014-06-25 DIAGNOSIS — Z331 Pregnant state, incidental: Secondary | ICD-10-CM

## 2014-06-25 LAB — POCT URINALYSIS DIPSTICK
Glucose, UA: NEGATIVE
KETONES UA: NEGATIVE
Leukocytes, UA: NEGATIVE
Nitrite, UA: NEGATIVE
RBC UA: NEGATIVE

## 2014-06-25 NOTE — Progress Notes (Signed)
Low-risk OB appointment G1P0 6043w3d Estimated Date of Delivery: 12/07/14 BP 108/58 mmHg  Wt 145 lb (65.772 kg)  LMP  (LMP Unknown)  BP, weight, and urine reviewed.  Refer to obstetrical flow sheet for FH & FHR.  Some flutters. Denies cramping, lof, vb, or uti s/s. No complaints. Had 1st IT/NT 1/20, declines 2nd IT d/t insurance Reviewed s/s to report. Plan:  Continue routine obstetrical care  F/U in 4wks for OB appointment and anatomy u/s

## 2014-06-25 NOTE — Patient Instructions (Signed)
Second Trimester of Pregnancy The second trimester is from week 13 through week 28, months 4 through 6. The second trimester is often a time when you feel your best. Your body has also adjusted to being pregnant, and you begin to feel better physically. Usually, morning sickness has lessened or quit completely, you may have more energy, and you may have an increase in appetite. The second trimester is also a time when the fetus is growing rapidly. At the end of the sixth month, the fetus is about 9 inches long and weighs about 1 pounds. You will likely begin to feel the baby move (quickening) between 18 and 20 weeks of the pregnancy. BODY CHANGES Your body goes through many changes during pregnancy. The changes vary from woman to woman.   Your weight will continue to increase. You will notice your lower abdomen bulging out.  You may begin to get stretch marks on your hips, abdomen, and breasts.  You may develop headaches that can be relieved by medicines approved by your health care provider.  You may urinate more often because the fetus is pressing on your bladder.  You may develop or continue to have heartburn as a result of your pregnancy.  You may develop constipation because certain hormones are causing the muscles that push waste through your intestines to slow down.  You may develop hemorrhoids or swollen, bulging veins (varicose veins).  You may have back pain because of the weight gain and pregnancy hormones relaxing your joints between the bones in your pelvis and as a result of a shift in weight and the muscles that support your balance.  Your breasts will continue to grow and be tender.  Your gums may bleed and may be sensitive to brushing and flossing.  Dark spots or blotches (chloasma, mask of pregnancy) may develop on your face. This will likely fade after the baby is born.  A dark line from your belly button to the pubic area (linea nigra) may appear. This will likely fade  after the baby is born.  You may have changes in your hair. These can include thickening of your hair, rapid growth, and changes in texture. Some women also have hair loss during or after pregnancy, or hair that feels dry or thin. Your hair will most likely return to normal after your baby is born. WHAT TO EXPECT AT YOUR PRENATAL VISITS During a routine prenatal visit:  You will be weighed to make sure you and the fetus are growing normally.  Your blood pressure will be taken.  Your abdomen will be measured to track your baby's growth.  The fetal heartbeat will be listened to.  Any test results from the previous visit will be discussed. Your health care provider may ask you:  How you are feeling.  If you are feeling the baby move.  If you have had any abnormal symptoms, such as leaking fluid, bleeding, severe headaches, or abdominal cramping.  If you have any questions. Other tests that may be performed during your second trimester include:  Blood tests that check for:  Low iron levels (anemia).  Gestational diabetes (between 24 and 28 weeks).  Rh antibodies.  Urine tests to check for infections, diabetes, or protein in the urine.  An ultrasound to confirm the proper growth and development of the baby.  An amniocentesis to check for possible genetic problems.  Fetal screens for spina bifida and Down syndrome. HOME CARE INSTRUCTIONS   Avoid all smoking, herbs, alcohol, and unprescribed   drugs. These chemicals affect the formation and growth of the baby.  Follow your health care provider's instructions regarding medicine use. There are medicines that are either safe or unsafe to take during pregnancy.  Exercise only as directed by your health care provider. Experiencing uterine cramps is a good sign to stop exercising.  Continue to eat regular, healthy meals.  Wear a good support bra for breast tenderness.  Do not use hot tubs, steam rooms, or saunas.  Wear your  seat belt at all times when driving.  Avoid raw meat, uncooked cheese, cat litter boxes, and soil used by cats. These carry germs that can cause birth defects in the baby.  Take your prenatal vitamins.  Try taking a stool softener (if your health care provider approves) if you develop constipation. Eat more high-fiber foods, such as fresh vegetables or fruit and whole grains. Drink plenty of fluids to keep your urine clear or pale yellow.  Take warm sitz baths to soothe any pain or discomfort caused by hemorrhoids. Use hemorrhoid cream if your health care provider approves.  If you develop varicose veins, wear support hose. Elevate your feet for 15 minutes, 3-4 times a day. Limit salt in your diet.  Avoid heavy lifting, wear low heel shoes, and practice good posture.  Rest with your legs elevated if you have leg cramps or low back pain.  Visit your dentist if you have not gone yet during your pregnancy. Use a soft toothbrush to brush your teeth and be gentle when you floss.  A sexual relationship may be continued unless your health care provider directs you otherwise.  Continue to go to all your prenatal visits as directed by your health care provider. SEEK MEDICAL CARE IF:   You have dizziness.  You have mild pelvic cramps, pelvic pressure, or nagging pain in the abdominal area.  You have persistent nausea, vomiting, or diarrhea.  You have a bad smelling vaginal discharge.  You have pain with urination. SEEK IMMEDIATE MEDICAL CARE IF:   You have a fever.  You are leaking fluid from your vagina.  You have spotting or bleeding from your vagina.  You have severe abdominal cramping or pain.  You have rapid weight gain or loss.  You have shortness of breath with chest pain.  You notice sudden or extreme swelling of your face, hands, ankles, feet, or legs.  You have not felt your baby move in over an hour.  You have severe headaches that do not go away with  medicine.  You have vision changes. Document Released: 04/19/2001 Document Revised: 04/30/2013 Document Reviewed: 06/26/2012 ExitCare Patient Information 2015 [employer]. This information is not intended to replace advice given to you by your health care provider. Make sure you discuss any questions you have with your health care provider.  

## 2014-07-03 ENCOUNTER — Telehealth: Payer: Self-pay | Admitting: Advanced Practice Midwife

## 2014-07-03 NOTE — Telephone Encounter (Signed)
Pt states had a + CHL results in January and was treated, now having discharge with odor. Call transferred to front staff for an appt to be scheduled with Gail MourningJennifer Griffin, NP for tomorrow, only day pt had off for next 2 weeks.

## 2014-07-04 ENCOUNTER — Encounter: Payer: Self-pay | Admitting: Adult Health

## 2014-07-04 ENCOUNTER — Ambulatory Visit (INDEPENDENT_AMBULATORY_CARE_PROVIDER_SITE_OTHER): Payer: BLUE CROSS/BLUE SHIELD | Admitting: Adult Health

## 2014-07-04 VITALS — BP 116/60 | HR 78 | Wt 147.0 lb

## 2014-07-04 DIAGNOSIS — N760 Acute vaginitis: Secondary | ICD-10-CM

## 2014-07-04 DIAGNOSIS — A499 Bacterial infection, unspecified: Secondary | ICD-10-CM

## 2014-07-04 DIAGNOSIS — Z331 Pregnant state, incidental: Secondary | ICD-10-CM

## 2014-07-04 DIAGNOSIS — Z1389 Encounter for screening for other disorder: Secondary | ICD-10-CM

## 2014-07-04 DIAGNOSIS — N898 Other specified noninflammatory disorders of vagina: Secondary | ICD-10-CM

## 2014-07-04 DIAGNOSIS — B9689 Other specified bacterial agents as the cause of diseases classified elsewhere: Secondary | ICD-10-CM

## 2014-07-04 HISTORY — DX: Other specified noninflammatory disorders of vagina: N89.8

## 2014-07-04 HISTORY — DX: Other specified bacterial agents as the cause of diseases classified elsewhere: B96.89

## 2014-07-04 LAB — POCT URINALYSIS DIPSTICK
Glucose, UA: NEGATIVE
KETONES UA: NEGATIVE
LEUKOCYTES UA: NEGATIVE
Nitrite, UA: NEGATIVE
Protein, UA: NEGATIVE

## 2014-07-04 LAB — POCT WET PREP (WET MOUNT): WBC WET PREP: POSITIVE

## 2014-07-04 MED ORDER — METRONIDAZOLE 500 MG PO TABS: 500.0000 mg | ORAL_TABLET | Freq: Two times a day (BID) | ORAL | Status: DC

## 2014-07-04 NOTE — Patient Instructions (Signed)
Bacterial Vaginosis Bacterial vaginosis is a vaginal infection that occurs when the normal balance of bacteria in the vagina is disrupted. It results from an overgrowth of certain bacteria. This is the most common vaginal infection in women of childbearing age. Treatment is important to prevent complications, especially in pregnant women, as it can cause a premature delivery. CAUSES  Bacterial vaginosis is caused by an increase in harmful bacteria that are normally present in smaller amounts in the vagina. Several different kinds of bacteria can cause bacterial vaginosis. However, the reason that the condition develops is not fully understood. RISK FACTORS Certain activities or behaviors can put you at an increased risk of developing bacterial vaginosis, including:  Having a new sex partner or multiple sex partners.  Douching.  Using an intrauterine device (IUD) for contraception. Women do not get bacterial vaginosis from toilet seats, bedding, swimming pools, or contact with objects around them. SIGNS AND SYMPTOMS  Some women with bacterial vaginosis have no signs or symptoms. Common symptoms include:  Grey vaginal discharge.  A fishlike odor with discharge, especially after sexual intercourse.  Itching or burning of the vagina and vulva.  Burning or pain with urination. DIAGNOSIS  Your health care provider will take a medical history and examine the vagina for signs of bacterial vaginosis. A sample of vaginal fluid may be taken. Your health care provider will look at this sample under a microscope to check for bacteria and abnormal cells. A vaginal pH test may also be done.  TREATMENT  Bacterial vaginosis may be treated with antibiotic medicines. These may be given in the form of a pill or a vaginal cream. A second round of antibiotics may be prescribed if the condition comes back after treatment.  HOME CARE INSTRUCTIONS   Only take over-the-counter or prescription medicines as  directed by your health care provider.  If antibiotic medicine was prescribed, take it as directed. Make sure you finish it even if you start to feel better.  Do not have sex until treatment is completed.  Tell all sexual partners that you have a vaginal infection. They should see their health care provider and be treated if they have problems, such as a mild rash or itching.  Practice safe sex by using condoms and only having one sex partner. SEEK MEDICAL CARE IF:   Your symptoms are not improving after 3 days of treatment.  You have increased discharge or pain.  You have a fever. MAKE SURE YOU:   Understand these instructions.  Will watch your condition.  Will get help right away if you are not doing well or get worse. FOR MORE INFORMATION  Centers for Disease Control and Prevention, Division of STD Prevention: www.cdc.gov/std American Sexual Health Association (ASHA): www.ashastd.org  Document Released: 04/25/2005 Document Revised: 02/13/2013 Document Reviewed: 12/05/2012 [employer] Patient Information 2015 [employer]. This information is not intended to replace advice given to you by your health care provider. Make sure you discuss any questions you have with your health care provider.  

## 2014-07-04 NOTE — Progress Notes (Signed)
G1P0 6755w5d Estimated Date of Delivery: 12/07/14      WORK IN APPT  HPI: Gail Johnson is a 24 year old black female in complaining of vaginal discharge for about 1 week, has history of chlamydia, will do POC today. Blood pressure 116/60, pulse 78, weight 147 lb (66.679 kg).  EXAM: Skin warm and dry.Pelvic: external genitalia is normal in appearance no lesions, vagina: white frothy discharge with odor,urethra has no lesions or masses noted, cervix:smooth, uterus: 17 week size, non tender, no masses felt, adnexa: no masses or tenderness noted. Bladder is non tender and no masses felt. Wet prep: + for clue cells and +WBCs. GC/CHL obtained.   BP weight and urine results all reviewed and noted. Urine trace blood  Please refer to the obstetrical flow sheet for the fundal height and fetal heart rate documentation: FHR 164 by doppler  Patient denies any bleeding and no rupture of membranes symptoms or regular contractions.  All questions were answered.  Plan:  Continued routine obstetrical care,  Rx flagyl 500 mg 1 bid x 7 days, no alcohol, review handout on BV   GC/CHL sent Follow up as scheduled for OB appointment,

## 2014-07-07 LAB — GC/CHLAMYDIA PROBE AMP
CHLAMYDIA, DNA PROBE: NEGATIVE
NEISSERIA GONORRHOEAE BY PCR: NEGATIVE

## 2014-07-23 ENCOUNTER — Ambulatory Visit (INDEPENDENT_AMBULATORY_CARE_PROVIDER_SITE_OTHER): Payer: BLUE CROSS/BLUE SHIELD

## 2014-07-23 ENCOUNTER — Ambulatory Visit (INDEPENDENT_AMBULATORY_CARE_PROVIDER_SITE_OTHER): Payer: BLUE CROSS/BLUE SHIELD | Admitting: Advanced Practice Midwife

## 2014-07-23 VITALS — BP 114/68 | HR 60 | Wt 150.0 lb

## 2014-07-23 DIAGNOSIS — Z3402 Encounter for supervision of normal first pregnancy, second trimester: Secondary | ICD-10-CM

## 2014-07-23 DIAGNOSIS — Z1389 Encounter for screening for other disorder: Secondary | ICD-10-CM

## 2014-07-23 DIAGNOSIS — R87610 Atypical squamous cells of undetermined significance on cytologic smear of cervix (ASC-US): Secondary | ICD-10-CM

## 2014-07-23 DIAGNOSIS — Z36 Encounter for antenatal screening of mother: Secondary | ICD-10-CM | POA: Diagnosis not present

## 2014-07-23 DIAGNOSIS — Z331 Pregnant state, incidental: Secondary | ICD-10-CM

## 2014-07-23 DIAGNOSIS — Z363 Encounter for antenatal screening for malformations: Secondary | ICD-10-CM

## 2014-07-23 DIAGNOSIS — A749 Chlamydial infection, unspecified: Secondary | ICD-10-CM

## 2014-07-23 LAB — POCT URINALYSIS DIPSTICK
Blood, UA: NEGATIVE
Glucose, UA: NEGATIVE
LEUKOCYTES UA: NEGATIVE
Nitrite, UA: NEGATIVE
Protein, UA: NEGATIVE

## 2014-07-23 NOTE — Progress Notes (Signed)
Anatomy US today,variable pos.,cervix closed,normal fluid,SDP 3.9cm,normal ov's,post pl grade 0,FHR137bpm,efw 375g witch is consistent w dates,all anatomy was visualized and appears normal.

## 2014-07-23 NOTE — Progress Notes (Signed)
G1P0 3476w3d Estimated Date of Delivery: 12/07/14  Blood pressure 114/68, pulse 60, weight 150 lb (68.04 kg).   BP weight and urine results all reviewed and noted.  Please refer to the obstetrical flow sheet for the fundal height and fetal heart rate documentation:Had anatomy scan today,  All normal per US tech,   Patient reports good fetal movement, denies any bleeding and no rupture of membranes symptoms or regular contractions. Patient is without complaints. All questions were answered.  Plan:  Continued routine obstetrical care,   Follow up in 4 weeks for OB appointment,

## 2014-08-19 DIAGNOSIS — Z029 Encounter for administrative examinations, unspecified: Secondary | ICD-10-CM

## 2014-08-20 ENCOUNTER — Ambulatory Visit (INDEPENDENT_AMBULATORY_CARE_PROVIDER_SITE_OTHER): Payer: BLUE CROSS/BLUE SHIELD | Admitting: Advanced Practice Midwife

## 2014-08-20 VITALS — BP 118/80 | HR 92 | Wt 155.0 lb

## 2014-08-20 DIAGNOSIS — Z3402 Encounter for supervision of normal first pregnancy, second trimester: Secondary | ICD-10-CM

## 2014-08-20 MED ORDER — PNV PRENATAL PLUS MULTIVITAMIN 27-1 MG PO TABS: 1.0000 | ORAL_TABLET | Freq: Every day | ORAL | Status: DC

## 2014-08-20 NOTE — Patient Instructions (Signed)

## 2014-08-20 NOTE — Progress Notes (Signed)
G1P0 3548w3d Estimated Date of Delivery: 12/07/14  Blood pressure 118/80, pulse 92, weight 155 lb (70.308 kg).   BP weight and urine results all reviewed and noted.  Please refer to the obstetrical flow sheet for the fundal height and fetal heart rate documentation:  Patient reports good fetal movement, denies any bleeding and no rupture of membranes symptoms or regular contractions. Patient is without complaints. All questions were answered.  Plan:  Continued routine obstetrical care,   Follow up in 3 weeks for OB appointment, PN2

## 2014-09-10 ENCOUNTER — Encounter: Payer: Self-pay | Admitting: Women's Health

## 2014-09-10 ENCOUNTER — Ambulatory Visit (INDEPENDENT_AMBULATORY_CARE_PROVIDER_SITE_OTHER): Payer: BLUE CROSS/BLUE SHIELD | Admitting: Women's Health

## 2014-09-10 ENCOUNTER — Other Ambulatory Visit: Payer: Self-pay | Admitting: Women's Health

## 2014-09-10 ENCOUNTER — Other Ambulatory Visit: Payer: BLUE CROSS/BLUE SHIELD

## 2014-09-10 VITALS — BP 118/70 | HR 84 | Wt 155.0 lb

## 2014-09-10 DIAGNOSIS — Z331 Pregnant state, incidental: Secondary | ICD-10-CM

## 2014-09-10 DIAGNOSIS — Z131 Encounter for screening for diabetes mellitus: Secondary | ICD-10-CM

## 2014-09-10 DIAGNOSIS — Z3402 Encounter for supervision of normal first pregnancy, second trimester: Secondary | ICD-10-CM

## 2014-09-10 DIAGNOSIS — Z1389 Encounter for screening for other disorder: Secondary | ICD-10-CM

## 2014-09-10 DIAGNOSIS — Z369 Encounter for antenatal screening, unspecified: Secondary | ICD-10-CM

## 2014-09-10 LAB — POCT URINALYSIS DIPSTICK
Glucose, UA: NEGATIVE
Ketones, UA: NEGATIVE
Leukocytes, UA: NEGATIVE
NITRITE UA: NEGATIVE
PROTEIN UA: NEGATIVE
RBC UA: NEGATIVE

## 2014-09-10 LAB — OB RESULTS CONSOLE HIV ANTIBODY (ROUTINE TESTING): HIV: NONREACTIVE

## 2014-09-10 NOTE — Patient Instructions (Addendum)
Call the office [phone]) or go to [facility] if:  You begin to have strong, frequent contractions  Your water breaks.  Sometimes it is a big gush of fluid, sometimes it is just a trickle that keeps getting your panties wet or running down your legs  You have vaginal bleeding.  It is normal to have a small amount of spotting if your cervix was checked.   You don't feel your baby moving like normal.  If you don't, get you something to eat and drink and lay down and focus on feeling your baby move.  You should feel at least 10 movements in 2 hours.  If you don't, you should call the office or go to [facility].   Constipation  Drink plenty of fluid, preferably water, throughout the day  Eat foods high in fiber such as fruits, vegetables, and grains  Exercise, such as walking, is a good way to keep your bowels regular  Drink warm fluids, especially warm prune juice, or decaf coffee  Eat a 1/2 cup of real oatmeal (not instant), 1/2 cup applesauce, and 1/2-1 cup warm prune juice every day  If needed, you may take Colace (docusate sodium) stool softener once or twice a day to help keep the stool soft. If you are pregnant, wait until you are out of your first trimester (12-14 weeks of pregnancy)  If you still are having problems with constipation, you may take Miralax once daily as needed to help keep your bowels regular.  If you are pregnant, wait until you are out of your first trimester (12-14 weeks of pregnancy)    Tdap Vaccine  It is recommended that you get the Tdap vaccine during the third trimester of EACH pregnancy to help protect your baby from getting pertussis (whooping cough)  27-36 weeks is the BEST time to do this so that you can pass the protection on to your baby. During pregnancy is better than after pregnancy, but if you are unable to get it during pregnancy it will be offered at the hospital.   You can get this vaccine at the health department or your family  doctor  Everyone who will be around your baby should also be up-to-date on their vaccines. Adults (who are not pregnant) only need 1 dose of Tdap during adulthood.   [place] Pediatricians/Family Doctors:  [facility] [phone]            [facility] [phone]                 [facility] [phone] (usually not accepting new patients unless you have family there already, you are always welcome to call and ask)            [facility] ([address] [place]) [phone]   [facility]/Family Doctors:   [facility]: [phone]  [facility]: [phone]    Third Trimester of Pregnancy The third trimester is from week 29 through week 42, months 7 through 9. The third trimester is a time when the fetus is growing rapidly. At the end of the ninth month, the fetus is about 20 inches in length and weighs 6-10 pounds.  BODY CHANGES Your body goes through many changes during pregnancy. The changes vary from woman to woman.  16. Your weight will continue to increase. You can expect to gain 25-35 pounds (11-16 kg) by the end of the pregnancy. 17. You may begin to get stretch marks on your hips,  abdomen, and breasts. 18. You may urinate more often because the fetus is moving lower into your pelvis and pressing on your bladder. 19. You may develop or continue to have heartburn as a result of your pregnancy. 20. You may develop constipation because certain hormones are causing the muscles that push waste through your intestines to slow down. 21. You may develop hemorrhoids or swollen, bulging veins (varicose veins). 22. You may have pelvic pain because of the weight gain and pregnancy hormones relaxing your joints between the bones in your pelvis. Backaches may result from overexertion of the muscles supporting your posture. 23. You may have changes in your hair. These can include thickening of  your hair, rapid growth, and changes in texture. Some women also have hair loss during or after pregnancy, or hair that feels dry or thin. Your hair will most likely return to normal after your baby is born. 24. Your breasts will continue to grow and be tender. A yellow discharge may leak from your breasts called colostrum. 25. Your belly button may stick out. 26. You may feel short of breath because of your expanding uterus. 27. You may notice the fetus "dropping," or moving lower in your abdomen. 28. You may have a bloody mucus discharge. This usually occurs a few days to a week before labor begins. 29. Your cervix becomes thin and soft (effaced) near your due date. WHAT TO EXPECT AT YOUR PRENATAL EXAMS  You will have prenatal exams every 2 weeks until week 36. Then, you will have weekly prenatal exams. During a routine prenatal visit: 3. You will be weighed to make sure you and the fetus are growing normally. 4. Your blood pressure is taken. 5. Your abdomen will be measured to track your baby's growth. 6. The fetal heartbeat will be listened to. 7. Any test results from the previous visit will be discussed. 8. You may have a cervical check near your due date to see if you have effaced. At around 36 weeks, your caregiver will check your cervix. At the same time, your caregiver will also perform a test on the secretions of the vaginal tissue. This test is to determine if a type of bacteria, Group B streptococcus, is present. Your caregiver will explain this further. Your caregiver may ask you: 2. What your birth plan is. 3. How you are feeling. 4. If you are feeling the baby move. 5. If you have had any abnormal symptoms, such as leaking fluid, bleeding, severe headaches, or abdominal cramping. 6. If you have any questions. Other tests or screenings that may be performed during your third trimester include: 2. Blood tests that check for low iron levels (anemia). 3. Fetal testing to check the  health, activity level, and growth of the fetus. Testing is done if you have certain medical conditions or if there are problems during the pregnancy. FALSE LABOR You may feel small, irregular contractions that eventually go away. These are called Braxton Hicks contractions, or false labor. Contractions may last for hours, days, or even weeks before true labor sets in. If contractions come at regular intervals, intensify, or become painful, it is best to be seen by your caregiver.  SIGNS OF LABOR  3. Menstrual-like cramps. 4. Contractions that are 5 minutes apart or less. 5. Contractions that start on the top of the uterus and spread down to the lower abdomen and back. 6. A sense of increased pelvic pressure or back pain. 7. A watery or bloody mucus discharge that comes  from the vagina. If you have any of these signs before the 37th week of pregnancy, call your caregiver right away. You need to go to the hospital to get checked immediately. HOME CARE INSTRUCTIONS   Avoid all smoking, herbs, alcohol, and unprescribed drugs. These chemicals affect the formation and growth of the baby.  Follow your caregiver's instructions regarding medicine use. There are medicines that are either safe or unsafe to take during pregnancy.  Exercise only as directed by your caregiver. Experiencing uterine cramps is a good sign to stop exercising.  Continue to eat regular, healthy meals.  Wear a good support bra for breast tenderness.  Do not use hot tubs, steam rooms, or saunas.  Wear your seat belt at all times when driving.  Avoid raw meat, uncooked cheese, cat litter boxes, and soil used by cats. These carry germs that can cause birth defects in the baby.  Take your prenatal vitamins.  Try taking a stool softener (if your caregiver approves) if you develop constipation. Eat more high-fiber foods, such as fresh vegetables or fruit and whole grains. Drink plenty of fluids to keep your urine clear or pale  yellow.  Take warm sitz baths to soothe any pain or discomfort caused by hemorrhoids. Use hemorrhoid cream if your caregiver approves.  If you develop varicose veins, wear support hose. Elevate your feet for 15 minutes, 3-4 times a day. Limit salt in your diet.  Avoid heavy lifting, wear low heal shoes, and practice good posture.  Rest a lot with your legs elevated if you have leg cramps or low back pain.  Visit your dentist if you have not gone during your pregnancy. Use a soft toothbrush to brush your teeth and be gentle when you floss.  A sexual relationship may be continued unless your caregiver directs you otherwise.  Do not travel far distances unless it is absolutely necessary and only with the approval of your caregiver.  Take prenatal classes to understand, practice, and ask questions about the labor and delivery.  Make a trial run to the hospital.  Pack your hospital bag.  Prepare the baby's nursery.  Continue to go to all your prenatal visits as directed by your caregiver. SEEK MEDICAL CARE IF:  You are unsure if you are in labor or if your water has broken.  You have dizziness.  You have mild pelvic cramps, pelvic pressure, or nagging pain in your abdominal area.  You have persistent nausea, vomiting, or diarrhea.  You have a bad smelling vaginal discharge.  You have pain with urination. SEEK IMMEDIATE MEDICAL CARE IF:   You have a fever.  You are leaking fluid from your vagina.  You have spotting or bleeding from your vagina.  You have severe abdominal cramping or pain.  You have rapid weight loss or gain.  You have shortness of breath with chest pain.  You notice sudden or extreme swelling of your face, hands, ankles, feet, or legs.  You have not felt your baby move in over an hour.  You have severe headaches that do not go away with medicine.  You have vision changes. Document Released: 04/19/2001 Document Revised: 04/30/2013 Document  Reviewed: 06/26/2012 [facility] Patient Information 2015 [place], [place]. This information is not intended to replace advice given to you by your health care provider. Make sure you discuss any questions you have with your health care provider.

## 2014-09-10 NOTE — Progress Notes (Signed)
Low-risk OB appointment G1P0 3173w3d Estimated Date of Delivery: 12/07/14 BP 118/70 mmHg  Pulse 84  Wt 155 lb (70.308 kg)  LMP  (LMP Unknown)  BP, weight, and urine reviewed.  Refer to obstetrical flow sheet for FH & FHR.  Reports good fm.  Denies regular uc's, lof, vb, or uti s/s. Nosebleeds, constipation. Discussed prevention/relief for both.  Reviewed ptl s/s, fkc. Recommended Tdap at HD/PCP per CDC guidelines.  Plan:  Continue routine obstetrical care  F/U in 4wks for OB appointment  PN2 today

## 2014-09-11 LAB — CBC
Hematocrit: 30.4 % — ABNORMAL LOW (ref 34.0–46.6)
Hemoglobin: 10.2 g/dL — ABNORMAL LOW (ref 11.1–15.9)
MCH: 27.2 pg (ref 26.6–33.0)
MCHC: 33.6 g/dL (ref 31.5–35.7)
MCV: 81 fL (ref 79–97)
Platelets: 280 10*3/uL (ref 150–379)
RBC: 3.75 x10E6/uL — ABNORMAL LOW (ref 3.77–5.28)
RDW: 15.1 % (ref 12.3–15.4)
WBC: 10.2 10*3/uL (ref 3.4–10.8)

## 2014-09-11 LAB — ANTIBODY SCREEN: ANTIBODY SCREEN: NEGATIVE

## 2014-09-11 LAB — RPR: RPR Ser Ql: NONREACTIVE

## 2014-09-11 LAB — GLUCOSE TOLERANCE, 1 HOUR: GLUCOSE, 1HR PP: 91 mg/dL (ref 65–199)

## 2014-09-11 LAB — HIV ANTIBODY (ROUTINE TESTING W REFLEX): HIV Screen 4th Generation wRfx: NONREACTIVE

## 2014-09-11 LAB — GLUCOSE, RANDOM: GLUCOSE: 61 mg/dL — AB (ref 65–99)

## 2014-09-11 LAB — HSV 2 ANTIBODY, IGG: HSV 2 Glycoprotein G Ab, IgG: 1.95 index — ABNORMAL HIGH (ref 0.00–0.90)

## 2014-09-22 ENCOUNTER — Telehealth: Payer: Self-pay | Admitting: *Deleted

## 2014-09-22 ENCOUNTER — Other Ambulatory Visit: Payer: Self-pay | Admitting: Women's Health

## 2014-09-22 MED ORDER — FERROUS SULFATE 325 (65 FE) MG PO TABS: 325.0000 mg | ORAL_TABLET | Freq: Two times a day (BID) | ORAL | Status: DC

## 2014-09-23 NOTE — Telephone Encounter (Signed)
Pt informed of anemia and instructed to take iron as well as PNV daily and increase iron rich foods, pt verbalized understanding.

## 2014-10-08 ENCOUNTER — Ambulatory Visit (INDEPENDENT_AMBULATORY_CARE_PROVIDER_SITE_OTHER): Payer: BLUE CROSS/BLUE SHIELD | Admitting: Advanced Practice Midwife

## 2014-10-08 ENCOUNTER — Encounter: Payer: Self-pay | Admitting: Advanced Practice Midwife

## 2014-10-08 VITALS — BP 100/60 | HR 92 | Wt 159.0 lb

## 2014-10-08 DIAGNOSIS — Z3403 Encounter for supervision of normal first pregnancy, third trimester: Secondary | ICD-10-CM

## 2014-10-08 DIAGNOSIS — Z331 Pregnant state, incidental: Secondary | ICD-10-CM

## 2014-10-08 DIAGNOSIS — Z1389 Encounter for screening for other disorder: Secondary | ICD-10-CM

## 2014-10-08 LAB — POCT URINALYSIS DIPSTICK
Glucose, UA: NEGATIVE
KETONES UA: NEGATIVE
Leukocytes, UA: NEGATIVE
Nitrite, UA: NEGATIVE
Protein, UA: NEGATIVE
RBC UA: NEGATIVE

## 2014-10-08 MED ORDER — ACYCLOVIR 400 MG PO TABS: 400.0000 mg | ORAL_TABLET | Freq: Three times a day (TID) | ORAL | Status: DC

## 2014-10-08 NOTE — Addendum Note (Signed)
Addended by: Gaylyn RongEVANS, Arasely Akkerman A on: 10/08/2014 10:00 AM   Modules accepted: Orders

## 2014-10-08 NOTE — Progress Notes (Signed)
G1P0 6618w3d Estimated Date of Delivery: 12/07/14  There were no vitals taken for this visit.   BP weight and urine results all reviewed and noted.  Please refer to the obstetrical flow sheet for the fundal height and fetal heart rate documentation:  Patient reports good fetal movement, denies any bleeding and no rupture of membranes symptoms or regular contractions. Patient is without complaints. All questions were answered.  Plan:  Continued routine obstetrical care. Form filled out to request parking closer to her apartment (I did not state that she was disabled, just pregnant and finds walking long distances difficult)  Follow up in 2 weeks for OB appointment,

## 2014-10-22 ENCOUNTER — Encounter: Payer: Self-pay | Admitting: Women's Health

## 2014-10-22 ENCOUNTER — Ambulatory Visit (INDEPENDENT_AMBULATORY_CARE_PROVIDER_SITE_OTHER): Payer: BLUE CROSS/BLUE SHIELD | Admitting: Women's Health

## 2014-10-22 VITALS — BP 104/62 | HR 76 | Wt 160.0 lb

## 2014-10-22 DIAGNOSIS — Z331 Pregnant state, incidental: Secondary | ICD-10-CM

## 2014-10-22 DIAGNOSIS — Z3403 Encounter for supervision of normal first pregnancy, third trimester: Secondary | ICD-10-CM

## 2014-10-22 DIAGNOSIS — Z1389 Encounter for screening for other disorder: Secondary | ICD-10-CM

## 2014-10-22 LAB — POCT URINALYSIS DIPSTICK
Blood, UA: NEGATIVE
Glucose, UA: NEGATIVE
Ketones, UA: NEGATIVE
Leukocytes, UA: NEGATIVE
Nitrite, UA: NEGATIVE
PROTEIN UA: NEGATIVE

## 2014-10-22 NOTE — Patient Instructions (Addendum)
Floradix (liquid Iron)  Call the office [phone]) or go to [facility] if:  You begin to have strong, frequent contractions  Your water breaks.  Sometimes it is a big gush of fluid, sometimes it is just a trickle that keeps getting your panties wet or running down your legs  You have vaginal bleeding.  It is normal to have a small amount of spotting if your cervix was checked.   You don't feel your baby moving like normal.  If you don't, get you something to eat and drink and lay down and focus on feeling your baby move.  You should feel at least 10 movements in 2 hours.  If you don't, you should call the office or go to [facility].    Preterm Labor Information Preterm labor is when labor starts at less than 37 weeks of pregnancy. The normal length of a pregnancy is 39 to 41 weeks. CAUSES Often, there is no identifiable underlying cause as to why a woman goes into preterm labor. One of the most common known causes of preterm labor is infection. Infections of the uterus, cervix, vagina, amniotic sac, bladder, kidney, or even the lungs (pneumonia) can cause labor to start. Other suspected causes of preterm labor include:   Urogenital infections, such as yeast infections and bacterial vaginosis.   Uterine abnormalities (uterine shape, uterine septum, fibroids, or bleeding from the placenta).   A cervix that has been operated on (it may fail to stay closed).   Malformations in the fetus.   Multiple gestations (twins, triplets, and so on).   Breakage of the amniotic sac.  RISK FACTORS  Having a previous history of preterm labor.   Having premature rupture of membranes (PROM).   Having a placenta that covers the opening of the cervix (placenta previa).   Having a placenta that separates from the uterus (placental abruption).   Having a cervix that is too weak to hold the fetus in the uterus (incompetent cervix).   Having too much fluid in the amniotic sac  (polyhydramnios).   Taking illegal drugs or smoking while pregnant.   Not gaining enough weight while pregnant.   Being younger than 65 and older than 24 years old.   Having a low socioeconomic status.   Being African American. SYMPTOMS Signs and symptoms of preterm labor include:   Menstrual-like cramps, abdominal pain, or back pain.  Uterine contractions that are regular, as frequent as six in an hour, regardless of their intensity (may be mild or painful).  Contractions that start on the top of the uterus and spread down to the lower abdomen and back.   A sense of increased pelvic pressure.   A watery or bloody mucus discharge that comes from the vagina.  TREATMENT Depending on the length of the pregnancy and other circumstances, your health care provider may suggest bed rest. If necessary, there are medicines that can be given to stop contractions and to mature the fetal lungs. If labor happens before 34 weeks of pregnancy, a prolonged hospital stay may be recommended. Treatment depends on the condition of both you and the fetus.  WHAT SHOULD YOU DO IF YOU THINK YOU ARE IN PRETERM LABOR? Call your health care provider right away. You will need to go to the hospital to get checked immediately. HOW CAN YOU PREVENT PRETERM LABOR IN FUTURE PREGNANCIES? You should:   Stop smoking if you smoke.  Maintain healthy weight gain and avoid chemicals and drugs that are not necessary.  Be watchful for any type of infection.  Inform your health care provider if you have a known history of preterm labor. Document Released: 07/16/2003 Document Revised: 12/26/2012 Document Reviewed: 05/28/2012 [facility] Patient Information 2015 [place], [place]. This information is not intended to replace advice given to you by your health care provider. Make sure you discuss any questions you have with your health care provider.  Iron-Rich Diet An iron-rich diet contains foods that are good sources  of iron. Iron is an important mineral that helps your body produce hemoglobin. Hemoglobin is a protein in red blood cells that carries oxygen to the body's tissues. Sometimes, the iron level in your blood can be low. This may be caused by:  A lack of iron in your diet.  Blood loss.  Times of growth, such as during pregnancy or during a child's growth and development. Low levels of iron can cause a decrease in the number of red blood cells. This can result in iron deficiency anemia. Iron deficiency anemia symptoms include:  Tiredness.  Weakness.  Irritability.  Increased chance of infection. Here are some recommendations for daily iron intake:  Males older than 24 years of age need 8 mg of iron per day.  Women ages 38 to 81 need 18 mg of iron per day.  Pregnant women need 27 mg of iron per day, and women who are over 33 years of age and breastfeeding need 9 mg of iron per day.  Women over the age of 23 need 8 mg of iron per day. SOURCES OF IRON There are 2 types of iron that are found in food: heme iron and nonheme iron. Heme iron is absorbed by the body better than nonheme iron. Heme iron is found in meat, poultry, and fish. Nonheme iron is found in grains, beans, and vegetables. Heme Iron Sources Food / Iron (mg)  Chicken liver, 3 oz (85 g)/ 10 mg  Beef liver, 3 oz (85 g)/ 5.5 mg  Oysters, 3 oz (85 g)/ 8 mg  Beef, 3 oz (85 g)/ 2 to 3 mg  Shrimp, 3 oz (85 g)/ 2.8 mg  [place], 3 oz (85 g)/ 2 mg  Chicken, 3 oz (85 g) / 1 mg  Fish (tuna, halibut), 3 oz (85 g)/ 1 mg  Pork, 3 oz (85 g)/ 0.9 mg Nonheme Iron Sources Food / Iron (mg)  Ready-to-eat breakfast cereal, iron-fortified / 3.9 to 7 mg  Tofu,  cup / 3.4 mg  Kidney beans,  cup / 2.6 mg  Baked potato with skin / 2.7 mg  Asparagus,  cup / 2.2 mg  Avocado / 2 mg  Dried peaches,  cup / 1.6 mg  Raisins,  cup / 1.5 mg  Soy milk, 1 cup / 1.5 mg  Whole-wheat bread, 1 slice / 1.2 mg  Spinach, 1 cup /  0.8 mg  Broccoli,  cup / 0.6 mg IRON ABSORPTION Certain foods can decrease the body's absorption of iron. Try to avoid these foods and beverages while eating meals with iron-containing foods:  Coffee.  Tea.  Fiber.  Soy. Foods containing vitamin C can help increase the amount of iron your body absorbs from iron sources, especially from nonheme sources. Eat foods with vitamin C along with iron-containing foods to increase your iron absorption. Foods that are high in vitamin C include many fruits and vegetables. Some good sources are:  Fresh orange juice.  Oranges.  Strawberries.  Mangoes.  Grapefruit.  Red bell peppers.  Green bell peppers.  Broccoli.  Potatoes with skin.  Tomato juice. Document Released: 12/07/2004 Document Revised: 07/18/2011 Document Reviewed: 10/14/2010 [facility] Patient Information 2015 [place], [place]. This information is not intended to replace advice given to you by your health care provider. Make sure you discuss any questions you have with your health care provider.

## 2014-10-22 NOTE — Progress Notes (Signed)
Low-risk OB appointment G1P0 [redacted]w[redacted]d Estimated Date of Delivery: 12/07/14 BP 104/62 mmHg  Pulse 76  Wt 160 lb (72.576 kg)  LMP  (LMP Unknown)  BP, weight, and urine reviewed.  Refer to obstetrical flow sheet for FH & FHR.  Reports good fm.  Denies regular uc's, lof, vb, or uti s/s. No complaints. Fe makes her nauseated, recommended Floradix.  Reviewed ptl s/s, fkc. Plan:  Continue routine obstetrical care  F/U in 2wks for OB appointment

## 2014-11-05 ENCOUNTER — Encounter: Payer: Self-pay | Admitting: Women's Health

## 2014-11-05 ENCOUNTER — Ambulatory Visit (INDEPENDENT_AMBULATORY_CARE_PROVIDER_SITE_OTHER): Payer: BLUE CROSS/BLUE SHIELD | Admitting: Women's Health

## 2014-11-05 VITALS — BP 110/52 | HR 92 | Wt 162.0 lb

## 2014-11-05 DIAGNOSIS — Z3403 Encounter for supervision of normal first pregnancy, third trimester: Secondary | ICD-10-CM

## 2014-11-05 DIAGNOSIS — Z331 Pregnant state, incidental: Secondary | ICD-10-CM

## 2014-11-05 DIAGNOSIS — Z1389 Encounter for screening for other disorder: Secondary | ICD-10-CM

## 2014-11-05 LAB — POCT URINALYSIS DIPSTICK
Blood, UA: NEGATIVE
GLUCOSE UA: NEGATIVE
Ketones, UA: NEGATIVE
Leukocytes, UA: NEGATIVE
NITRITE UA: NEGATIVE
PROTEIN UA: NEGATIVE

## 2014-11-05 NOTE — Progress Notes (Signed)
Low-risk OB appointment G1P0 2518w3d Estimated Date of Delivery: 12/07/14 BP 110/52 mmHg  Pulse 92  Wt 162 lb (73.483 kg)  LMP  (LMP Unknown)  BP, weight, and urine reviewed.  Refer to obstetrical flow sheet for FH & FHR.  Reports good fm.  Denies regular uc's, lof, vb, or uti s/s. Had some bad pains in lower abdomen last night, more while standing or laughing- doesn't sound like uc's, more like RLP/musculoskeletal.  Reviewed ptl s/s, fkc. Plan:  Continue routine obstetrical care  F/U in 1wk for OB appointment and gbs

## 2014-11-13 ENCOUNTER — Ambulatory Visit (INDEPENDENT_AMBULATORY_CARE_PROVIDER_SITE_OTHER): Payer: BLUE CROSS/BLUE SHIELD | Admitting: Advanced Practice Midwife

## 2014-11-13 VITALS — BP 104/54 | HR 100 | Wt 162.0 lb

## 2014-11-13 DIAGNOSIS — Z1389 Encounter for screening for other disorder: Secondary | ICD-10-CM

## 2014-11-13 DIAGNOSIS — Z369 Encounter for antenatal screening, unspecified: Secondary | ICD-10-CM

## 2014-11-13 DIAGNOSIS — Z331 Pregnant state, incidental: Secondary | ICD-10-CM

## 2014-11-13 DIAGNOSIS — Z3403 Encounter for supervision of normal first pregnancy, third trimester: Secondary | ICD-10-CM

## 2014-11-13 DIAGNOSIS — Z3685 Encounter for antenatal screening for Streptococcus B: Secondary | ICD-10-CM

## 2014-11-13 LAB — POCT URINALYSIS DIPSTICK
Blood, UA: NEGATIVE
Glucose, UA: NEGATIVE
Ketones, UA: NEGATIVE
Leukocytes, UA: NEGATIVE
Nitrite, UA: NEGATIVE
PROTEIN UA: NEGATIVE

## 2014-11-13 LAB — OB RESULTS CONSOLE GC/CHLAMYDIA
Chlamydia: NEGATIVE
Gonorrhea: NEGATIVE

## 2014-11-13 LAB — OB RESULTS CONSOLE GBS: STREP GROUP B AG: NEGATIVE

## 2014-11-13 NOTE — Patient Instructions (Signed)

## 2014-11-13 NOTE — Progress Notes (Signed)
G1P0 4146w4d Estimated Date of Delivery: 12/07/14  Blood pressure 104/54, pulse 100, weight 162 lb (73.483 kg).   BP weight and urine results all reviewed and noted.  Please refer to the obstetrical flow sheet for the fundal height and fetal heart rate documentation:  Patient reports good fetal movement, denies any bleeding and no rupture of membranes symptoms or regular contractions. Patient is without complaints. All questions were answered.  Plan:  Continued routine obstetrical care, GBS  Follow up in 1 weeks for OB appointment,

## 2014-11-14 LAB — GC/CHLAMYDIA PROBE AMP
Chlamydia trachomatis, NAA: NEGATIVE
NEISSERIA GONORRHOEAE BY PCR: NEGATIVE

## 2014-11-18 LAB — CULTURE, BETA STREP (GROUP B ONLY): Strep Gp B Culture: NEGATIVE

## 2014-11-19 ENCOUNTER — Ambulatory Visit (INDEPENDENT_AMBULATORY_CARE_PROVIDER_SITE_OTHER): Payer: BLUE CROSS/BLUE SHIELD | Admitting: Women's Health

## 2014-11-19 VITALS — BP 104/56 | HR 105 | Wt 162.0 lb

## 2014-11-19 DIAGNOSIS — Z1389 Encounter for screening for other disorder: Secondary | ICD-10-CM

## 2014-11-19 DIAGNOSIS — Z331 Pregnant state, incidental: Secondary | ICD-10-CM

## 2014-11-19 DIAGNOSIS — Z3403 Encounter for supervision of normal first pregnancy, third trimester: Secondary | ICD-10-CM

## 2014-11-19 LAB — POCT URINALYSIS DIPSTICK
Blood, UA: NEGATIVE
Glucose, UA: NEGATIVE
KETONES UA: NEGATIVE
LEUKOCYTES UA: NEGATIVE
NITRITE UA: NEGATIVE
PROTEIN UA: NEGATIVE

## 2014-11-19 NOTE — Progress Notes (Signed)
Low-risk OB appointment G1P0 589w3d Estimated Date of Delivery: 12/07/14 BP 104/56 mmHg  Pulse 105  Wt 162 lb (73.483 kg)  LMP  (LMP Unknown)  BP, weight, and urine reviewed.  Refer to obstetrical flow sheet for FH & FHR.  Reports good fm.  Denies regular uc's, lof, vb, or uti s/s. No complaints. Declines SVE, states baby was head down last visit and cx was closed Reviewed labor s/s, fkc. Plan:  Continue routine obstetrical care  F/U in 1wk for OB appointment

## 2014-11-19 NOTE — Patient Instructions (Addendum)
Call the office ([phone]) or go to [facility] if:  You begin to have strong, frequent contractions  Your water breaks.  Sometimes it is a big gush of fluid, sometimes it is just a trickle that keeps getting your panties wet or running down your legs  You have vaginal bleeding.  It is normal to have a small amount of spotting if your cervix was checked.   You don't feel your baby moving like normal.  If you don't, get you something to eat and drink and lay down and focus on feeling your baby move.  You should feel at least 10 movements in 2 hours.  If you don't, you should call the office or go to [facility].    Braxton Hicks Contractions Contractions of the uterus can occur throughout pregnancy. Contractions are not always a sign that you are in labor.  WHAT ARE BRAXTON HICKS CONTRACTIONS?  Contractions that occur before labor are called Braxton Hicks contractions, or false labor. Toward the end of pregnancy (32-34 weeks), these contractions can develop more often and may become more forceful. This is not true labor because these contractions do not result in opening (dilatation) and thinning of the cervix. They are sometimes difficult to tell apart from true labor because these contractions can be forceful and people have different pain tolerances. You should not feel embarrassed if you go to the hospital with false labor. Sometimes, the only way to tell if you are in true labor is for your health care provider to look for changes in the cervix. If there are no prenatal problems or other health problems associated with the pregnancy, it is completely safe to be sent home with false labor and await the onset of true labor. HOW CAN YOU TELL THE DIFFERENCE BETWEEN TRUE AND FALSE LABOR? False Labor  The contractions of false labor are usually shorter and not as hard as those of true labor.   The contractions are usually irregular.   The contractions are often felt in the front of  the lower abdomen and in the groin.   The contractions may go away when you walk around or change positions while lying down.   The contractions get weaker and are shorter lasting as time goes on.   The contractions do not usually become progressively stronger, regular, and closer together as with true labor.  True Labor  Contractions in true labor last 30-70 seconds, become very regular, usually become more intense, and increase in frequency.   The contractions do not go away with walking.   The discomfort is usually felt in the top of the uterus and spreads to the lower abdomen and low back.   True labor can be determined by your health care provider with an exam. This will show that the cervix is dilating and getting thinner.  WHAT TO REMEMBER  Keep up with your usual exercises and follow other instructions given by your health care provider.   Take medicines as directed by your health care provider.   Keep your regular prenatal appointments.   Eat and drink lightly if you think you are going into labor.   If Braxton Hicks contractions are making you uncomfortable:   Change your position from lying down or resting to walking, or from walking to resting.   Sit and rest in a tub of warm water.   Drink 2-3 glasses of water. Dehydration may cause these contractions.   Do slow and deep breathing several times an hour.    WHEN SHOULD I SEEK IMMEDIATE MEDICAL CARE? Seek immediate medical care if:  Your contractions become stronger, more regular, and closer together.   You have fluid leaking or gushing from your vagina.   You have a fever.   You pass blood-tinged mucus.   You have vaginal bleeding.   You have continuous abdominal pain.   You have low back pain that you never had before.   You feel your baby's head pushing down and causing pelvic pressure.   Your baby is not moving as much as it used to.  Document Released: 04/25/2005 Document  Revised: 04/30/2013 Document Reviewed: 02/04/2013 [employer] Patient Information 2015 [employer]. This information is not intended to replace advice given to you by your health care provider. Make sure you discuss any questions you have with your health care provider.  

## 2014-11-28 ENCOUNTER — Encounter: Payer: Self-pay | Admitting: Obstetrics & Gynecology

## 2014-11-28 ENCOUNTER — Ambulatory Visit (INDEPENDENT_AMBULATORY_CARE_PROVIDER_SITE_OTHER): Payer: BLUE CROSS/BLUE SHIELD | Admitting: Obstetrics & Gynecology

## 2014-11-28 VITALS — BP 108/70 | HR 72 | Wt 165.4 lb

## 2014-11-28 DIAGNOSIS — Z3403 Encounter for supervision of normal first pregnancy, third trimester: Secondary | ICD-10-CM

## 2014-11-28 DIAGNOSIS — Z1389 Encounter for screening for other disorder: Secondary | ICD-10-CM

## 2014-11-28 DIAGNOSIS — Z331 Pregnant state, incidental: Secondary | ICD-10-CM

## 2014-11-28 LAB — POCT URINALYSIS DIPSTICK
GLUCOSE UA: NEGATIVE
Leukocytes, UA: NEGATIVE
Nitrite, UA: NEGATIVE
Protein, UA: NEGATIVE
RBC UA: NEGATIVE

## 2014-11-28 NOTE — Progress Notes (Signed)
G1P0 [redacted]w[redacted]d Estimated Date of Delivery: 12/07/14  Blood pressure 108/70, pulse 72, weight 165 lb 6.4 oz (75.025 kg).   BP weight and urine results all reviewed and noted.  Please refer to the obstetrical flow sheet for the fundal height and fetal heart rate documentation:  Patient reports good fetal movement, denies any bleeding and no rupture of membranes symptoms or regular contractions. Patient is without complaints. All questions were answered.  Plan:  Continued routine obstetrical care,   Follow up in 1 weeks for OB appointment,

## 2014-12-05 ENCOUNTER — Ambulatory Visit (INDEPENDENT_AMBULATORY_CARE_PROVIDER_SITE_OTHER): Payer: BLUE CROSS/BLUE SHIELD | Admitting: Obstetrics & Gynecology

## 2014-12-05 ENCOUNTER — Encounter: Payer: Self-pay | Admitting: Obstetrics & Gynecology

## 2014-12-05 VITALS — BP 108/70 | HR 80 | Wt 165.0 lb

## 2014-12-05 DIAGNOSIS — Z1389 Encounter for screening for other disorder: Secondary | ICD-10-CM

## 2014-12-05 DIAGNOSIS — Z3403 Encounter for supervision of normal first pregnancy, third trimester: Secondary | ICD-10-CM

## 2014-12-05 DIAGNOSIS — Z331 Pregnant state, incidental: Secondary | ICD-10-CM

## 2014-12-05 LAB — POCT URINALYSIS DIPSTICK
Blood, UA: NEGATIVE
GLUCOSE UA: NEGATIVE
Leukocytes, UA: NEGATIVE
NITRITE UA: NEGATIVE
Protein, UA: NEGATIVE

## 2014-12-05 NOTE — Progress Notes (Signed)
G1P0 [redacted]w[redacted]d Estimated Date of Delivery: 12/07/14  Blood pressure 108/70, pulse 80, weight 165 lb (74.844 kg).   BP weight and urine results all reviewed and noted.  Please refer to the obstetrical flow sheet for the fundal height and fetal heart rate documentation:  Patient reports good fetal movement, denies any bleeding and no rupture of membranes symptoms or regular contractions. Patient is without complaints. All questions were answered.  Plan:  Continued routine obstetrical care,   Follow up in 6 weeks for OB appointment, pp exam

## 2014-12-09 ENCOUNTER — Inpatient Hospital Stay (HOSPITAL_COMMUNITY)
Admission: AD | Admit: 2014-12-09 | Discharge: 2014-12-09 | Disposition: A | Discharge status: Home / Self Care | Payer: BLUE CROSS/BLUE SHIELD | Source: Ambulatory Visit | Attending: Family Medicine | Admitting: Family Medicine

## 2014-12-09 ENCOUNTER — Encounter (HOSPITAL_COMMUNITY): Payer: Self-pay | Admitting: *Deleted

## 2014-12-09 DIAGNOSIS — Z3A40 40 weeks gestation of pregnancy: Secondary | ICD-10-CM

## 2014-12-09 DIAGNOSIS — O471 False labor at or after 37 completed weeks of gestation: Secondary | ICD-10-CM | POA: Diagnosis not present

## 2014-12-09 DIAGNOSIS — O9989 Other specified diseases and conditions complicating pregnancy, childbirth and the puerperium: Secondary | ICD-10-CM | POA: Diagnosis present

## 2014-12-09 LAB — POCT FERN TEST: POCT Fern Test: NEGATIVE

## 2014-12-09 NOTE — Discharge Instructions (Signed)
Keep your scheduled appointment for induction. Call the office or provider on call with further concerns for an office visit.

## 2014-12-09 NOTE — MAU Provider Note (Signed)
S:  24 y.o. G1P0 @  presents to MAU for rule out rupture of membranes. Leakage of fluid started at 10 am when she woke up. She noticed clear fluid on the bed in a wet spot about 5 inches across when she woke up. This has never happened before. She denies any leakage since this episode and has not required a pad for the leakage. She denies regular contractions.  She reports good fetal movement, denies vaginal bleeding, vaginal itching/burning, urinary symptoms, h/a, dizziness, n/v, or fever/chills.    Review of Systems  Constitutional: Negative for fever, chills and malaise/fatigue.  Eyes: Negative for blurred vision.  Respiratory: Negative for cough and shortness of breath.   Cardiovascular: Negative for chest pain.  Gastrointestinal: Negative for heartburn, nausea, vomiting and abdominal pain.  Genitourinary: Negative for dysuria, urgency and frequency.  Musculoskeletal: Negative.   Neurological: Negative for dizziness and headaches.  Psychiatric/Behavioral: Negative for depression.   O:  BP 111/75 mmHg  Pulse 79  Temp(Src) 98.4 F (36.9 C) (Oral)  Resp 18  LMP  (LMP Unknown) Physical Examination: General appearance - alert, well appearing, and in no distress, oriented to person, place, and time and acyanotic, in no respiratory distress  Speculum exam reveals negative pooling of clear fluid Ferning slide negative  Dilation: 1.5 Effacement (%): 50 Cervical Position: Posterior Station: -3 Presentation: Vertex Exam by:: LCraige Cotta, CNM  FHR baseline 135 with moderate variability, positive accels, no decels Toco with occasional contractions mild to palpation  A: G1P0 @ [redacted]w[redacted]d with threatened labor at term GBS neg  P: D/C home with term labor precautions Keep scheduled induction of labor on 12/14/14 Return to MAU as needed for s/sx of labor or emergencies  LEFTWICH-KIRBY, Kenta Laster, CNM 11:54 AM

## 2014-12-09 NOTE — MAU Note (Signed)
Last night had the worst pains ever, like bad menstrual cramps.  No longer having them.  At 0730 this morning, had a gush of clear fluid, none since, no bleeding.

## 2014-12-11 ENCOUNTER — Telehealth (HOSPITAL_COMMUNITY): Payer: Self-pay | Admitting: *Deleted

## 2014-12-11 NOTE — Telephone Encounter (Signed)
Preadmission screen  

## 2014-12-12 ENCOUNTER — Telehealth: Payer: Self-pay | Admitting: *Deleted

## 2014-12-12 ENCOUNTER — Encounter (HOSPITAL_COMMUNITY): Payer: Self-pay | Admitting: *Deleted

## 2014-12-12 ENCOUNTER — Telehealth (HOSPITAL_COMMUNITY): Payer: Self-pay | Admitting: *Deleted

## 2014-12-12 ENCOUNTER — Ambulatory Visit (INDEPENDENT_AMBULATORY_CARE_PROVIDER_SITE_OTHER): Payer: BLUE CROSS/BLUE SHIELD | Admitting: Obstetrics and Gynecology

## 2014-12-12 VITALS — BP 110/70 | HR 92 | Wt 166.4 lb

## 2014-12-12 DIAGNOSIS — Z331 Pregnant state, incidental: Secondary | ICD-10-CM

## 2014-12-12 DIAGNOSIS — Z3403 Encounter for supervision of normal first pregnancy, third trimester: Secondary | ICD-10-CM

## 2014-12-12 DIAGNOSIS — Z1389 Encounter for screening for other disorder: Secondary | ICD-10-CM

## 2014-12-12 LAB — POCT URINALYSIS DIPSTICK
GLUCOSE UA: NEGATIVE
Ketones, UA: NEGATIVE
Leukocytes, UA: NEGATIVE
Nitrite, UA: NEGATIVE
Protein, UA: NEGATIVE
RBC UA: NEGATIVE

## 2014-12-12 NOTE — Telephone Encounter (Signed)
Spoke with Amy, preadmission nurse at [facility]. Pt is being induced on 12/14/14. Pt wasn't scheduled for a visit this week. She was seen in MAU earlier this week.  Pt to come in and be seen before 1 pm today. Pt voiced understanding. JSY

## 2014-12-12 NOTE — Telephone Encounter (Signed)
Preadmission screen  

## 2014-12-12 NOTE — Progress Notes (Signed)
Patient ID: Gail Johnson, female   DOB: 1990-06-29, 24 y.o.   MRN: [id number]  G1P0 [redacted]w[redacted]d Estimated Date of Delivery: 12/07/14  Blood pressure 110/70, pulse 92, weight 166 lb 6.4 oz (75.479 kg).   refer to the ob flow sheet for FH and FHR, also BP, Wt, Urine results:notable for negative  Patient reports + good fetal movement, denies any bleeding and no rupture of membranes symptoms or regular contractions. Patient complaints: None.   FH: 38 cm FHR: 140 bpm Cervix: 1/long/-2  Questions were answered. Assessment: Pregnancy [redacted]w[redacted]d, LROB Plan:  Continued routine obstetrical care,  Patient is scheduled for an induction in 2 days, on Sunday.    This chart was SCRIBED for Christin Bach, MD by Ronney Lion, ED Scribe. This patient was seen in room 1, and the patient's care was started at 1:36 PM.  I personally performed the services described in this documentation, which was SCRIBED in my presence. The recorded information has been reviewed and considered accurate. It has been edited as necessary during review. Tilda Burrow, MD

## 2014-12-14 ENCOUNTER — Inpatient Hospital Stay (HOSPITAL_COMMUNITY)
Admission: RE | Admit: 2014-12-14 | Discharge: 2014-12-17 | DRG: 775 | Disposition: A | Discharge status: Home / Self Care | Payer: BLUE CROSS/BLUE SHIELD | Source: Ambulatory Visit | Attending: Obstetrics and Gynecology | Admitting: Obstetrics and Gynecology

## 2014-12-14 ENCOUNTER — Encounter (HOSPITAL_COMMUNITY): Payer: Self-pay

## 2014-12-14 VITALS — BP 109/63 | HR 88 | Temp 98.4°F | Resp 17 | Ht 67.0 in | Wt 166.0 lb

## 2014-12-14 DIAGNOSIS — Z3A41 41 weeks gestation of pregnancy: Secondary | ICD-10-CM | POA: Diagnosis present

## 2014-12-14 DIAGNOSIS — O480 Post-term pregnancy: Secondary | ICD-10-CM | POA: Diagnosis present

## 2014-12-14 DIAGNOSIS — N898 Other specified noninflammatory disorders of vagina: Secondary | ICD-10-CM

## 2014-12-14 DIAGNOSIS — A749 Chlamydial infection, unspecified: Secondary | ICD-10-CM

## 2014-12-14 DIAGNOSIS — Z3403 Encounter for supervision of normal first pregnancy, third trimester: Secondary | ICD-10-CM

## 2014-12-14 DIAGNOSIS — Z889 Allergy status to unspecified drugs, medicaments and biological substances status: Secondary | ICD-10-CM

## 2014-12-14 DIAGNOSIS — R87610 Atypical squamous cells of undetermined significance on cytologic smear of cervix (ASC-US): Secondary | ICD-10-CM

## 2014-12-14 DIAGNOSIS — B9689 Other specified bacterial agents as the cause of diseases classified elsewhere: Secondary | ICD-10-CM

## 2014-12-14 DIAGNOSIS — B009 Herpesviral infection, unspecified: Secondary | ICD-10-CM

## 2014-12-14 DIAGNOSIS — N760 Acute vaginitis: Secondary | ICD-10-CM

## 2014-12-14 LAB — CBC
HCT: 34.9 % — ABNORMAL LOW (ref 36.0–46.0)
Hemoglobin: 11.7 g/dL — ABNORMAL LOW (ref 12.0–15.0)
MCH: 27.2 pg (ref 26.0–34.0)
MCHC: 33.5 g/dL (ref 30.0–36.0)
MCV: 81.2 fL (ref 78.0–100.0)
PLATELETS: 243 10*3/uL (ref 150–400)
RBC: 4.3 MIL/uL (ref 3.87–5.11)
RDW: 15.3 % (ref 11.5–15.5)
WBC: 12.9 10*3/uL — ABNORMAL HIGH (ref 4.0–10.5)

## 2014-12-14 LAB — ABO/RH: ABO/RH(D): O POS

## 2014-12-14 LAB — TYPE AND SCREEN
ABO/RH(D): O POS
ANTIBODY SCREEN: NEGATIVE

## 2014-12-14 MED ORDER — TERBUTALINE SULFATE 1 MG/ML IJ SOLN: 0.2500 mg | Freq: Once | INTRAMUSCULAR | Status: AC | PRN

## 2014-12-14 MED ORDER — ACETAMINOPHEN 325 MG PO TABS: 650.0000 mg | ORAL_TABLET | ORAL | Status: DC | PRN

## 2014-12-14 MED ORDER — LACTATED RINGERS IV SOLN: 500.0000 mL | INTRAVENOUS | Status: DC | PRN

## 2014-12-14 MED ORDER — DIPHENHYDRAMINE HCL 50 MG/ML IJ SOLN: 12.5000 mg | INTRAMUSCULAR | Status: DC | PRN

## 2014-12-14 MED ORDER — ONDANSETRON HCL 4 MG/2ML IJ SOLN: 4.0000 mg | Freq: Four times a day (QID) | INTRAMUSCULAR | Status: DC | PRN

## 2014-12-14 MED ORDER — PHENYLEPHRINE 40 MCG/ML (10ML) SYRINGE FOR IV PUSH (FOR BLOOD PRESSURE SUPPORT)
80.0000 ug | PREFILLED_SYRINGE | INTRAVENOUS | Status: DC | PRN
  Filled 2014-12-14: qty 2
  Filled 2014-12-14: qty 20

## 2014-12-14 MED ORDER — EPHEDRINE 5 MG/ML INJ
10.0000 mg | INTRAVENOUS | Status: DC | PRN
Start: 2014-12-14 — End: 2014-12-15
  Filled 2014-12-14: qty 2

## 2014-12-14 MED ORDER — CITRIC ACID-SODIUM CITRATE 334-500 MG/5ML PO SOLN: 30.0000 mL | ORAL | Status: DC | PRN

## 2014-12-14 MED ORDER — FENTANYL 2.5 MCG/ML BUPIVACAINE 1/10 % EPIDURAL INFUSION (WH - ANES)
14.0000 mL/h | INTRAMUSCULAR | Status: DC | PRN
  Administered 2014-12-15: 14 mL/h via EPIDURAL
  Filled 2014-12-14: qty 125

## 2014-12-14 MED ORDER — LIDOCAINE HCL (PF) 1 % IJ SOLN
30.0000 mL | INTRAMUSCULAR | Status: DC | PRN
  Filled 2014-12-14: qty 30

## 2014-12-14 MED ORDER — OXYTOCIN BOLUS FROM INFUSION
500.0000 mL | INTRAVENOUS | Status: DC
  Administered 2014-12-15: 500 mL via INTRAVENOUS

## 2014-12-14 MED ORDER — MISOPROSTOL 25 MCG QUARTER TABLET
25.0000 ug | ORAL_TABLET | ORAL | Status: DC | PRN
  Administered 2014-12-14 (×3): 25 ug via VAGINAL
  Filled 2014-12-14 (×3): qty 0.25
  Filled 2014-12-14: qty 1

## 2014-12-14 MED ORDER — LACTATED RINGERS IV SOLN
INTRAVENOUS | Status: DC
  Administered 2014-12-15: 01:00:00 via INTRAVENOUS

## 2014-12-14 MED ORDER — FENTANYL CITRATE (PF) 100 MCG/2ML IJ SOLN
100.0000 ug | INTRAMUSCULAR | Status: DC | PRN
  Administered 2014-12-14 (×2): 100 ug via INTRAVENOUS
  Filled 2014-12-14 (×2): qty 2

## 2014-12-14 MED ORDER — OXYTOCIN 40 UNITS IN LACTATED RINGERS INFUSION - SIMPLE MED
62.5000 mL/h | INTRAVENOUS | Status: DC
Start: 2014-12-14 — End: 2014-12-15
  Filled 2014-12-14: qty 1000

## 2014-12-14 NOTE — Progress Notes (Signed)
Labor Progress Note  S: Pt seen w/ Dr. Delbert Harness and RN at bedside for [facility] placement. Pt just out of shower. Currently feeling pressure/pain w/ contractions but not wanting epidural. Wants to start fentanyl  O:  BP 115/76 mmHg  Pulse 91  Temp(Src) 98.4 F (36.9 C) (Oral)  Resp 18  Ht  (1.702 m)  Wt 75.297 kg (166 lb)  BMI 25.99 kg/m2  LMP  (LMP Unknown) Cat I FHR 150s, mod var, +acels, -decels, contx q2-3 min CVE: 2/50/  A&P: 24 y.o. G1P0 [redacted]w[redacted]d IOL 2/2 post dates #FB placed at 2030 #will allow FB to expel on its own and then monitor for cervical progress for about 2 hrs  #if no progress after 2 hrs, will add pit titration #fent q1 prn #expecting normal progression to vag delivery  Lowanda Foster, MD 10:11 PM

## 2014-12-14 NOTE — Progress Notes (Signed)
Patient ID: Gail Johnson, female   DOB: 02/21/1991, 25 y.o.   MRN: [id number] S: pt offered foly bulb to facilitate dilitation but pt and husb decline procedure. States wants as little intervention as possible.  O: VSS, FHR pattern reassurring, mild uc's q 5-6 min apart A: IOL for post dates P: continue present POC

## 2014-12-15 ENCOUNTER — Inpatient Hospital Stay (HOSPITAL_COMMUNITY): Payer: BLUE CROSS/BLUE SHIELD | Admitting: Anesthesiology

## 2014-12-15 ENCOUNTER — Encounter (HOSPITAL_COMMUNITY): Payer: Self-pay

## 2014-12-15 DIAGNOSIS — O480 Post-term pregnancy: Secondary | ICD-10-CM

## 2014-12-15 DIAGNOSIS — Z3A41 41 weeks gestation of pregnancy: Secondary | ICD-10-CM

## 2014-12-15 LAB — HIV ANTIBODY (ROUTINE TESTING W REFLEX): HIV SCREEN 4TH GENERATION: NONREACTIVE

## 2014-12-15 LAB — RPR: RPR Ser Ql: NONREACTIVE

## 2014-12-15 MED ORDER — IBUPROFEN 600 MG PO TABS
600.0000 mg | ORAL_TABLET | Freq: Four times a day (QID) | ORAL | Status: DC
  Administered 2014-12-15 – 2014-12-17 (×9): 600 mg via ORAL
  Filled 2014-12-15 (×9): qty 1

## 2014-12-15 MED ORDER — SENNOSIDES-DOCUSATE SODIUM 8.6-50 MG PO TABS
2.0000 | ORAL_TABLET | ORAL | Status: DC
  Administered 2014-12-16: 2 via ORAL
  Filled 2014-12-15 (×2): qty 2

## 2014-12-15 MED ORDER — TETANUS-DIPHTH-ACELL PERTUSSIS 5-2.5-18.5 LF-MCG/0.5 IM SUSP: 0.5000 mL | Freq: Once | INTRAMUSCULAR | Status: DC

## 2014-12-15 MED ORDER — ACETAMINOPHEN 325 MG PO TABS: 650.0000 mg | ORAL_TABLET | ORAL | Status: DC | PRN

## 2014-12-15 MED ORDER — PRENATAL MULTIVITAMIN CH
1.0000 | ORAL_TABLET | Freq: Every day | ORAL | Status: DC
  Administered 2014-12-15 – 2014-12-17 (×3): 1 via ORAL
  Filled 2014-12-15 (×3): qty 1

## 2014-12-15 MED ORDER — ONDANSETRON HCL 4 MG PO TABS: 4.0000 mg | ORAL_TABLET | ORAL | Status: DC | PRN

## 2014-12-15 MED ORDER — METHYLERGONOVINE MALEATE 0.2 MG/ML IJ SOLN
0.2000 mg | Freq: Once | INTRAMUSCULAR | Status: AC
  Administered 2014-12-15: 0.2 mg via INTRAMUSCULAR

## 2014-12-15 MED ORDER — DIPHENHYDRAMINE HCL 25 MG PO CAPS: 25.0000 mg | ORAL_CAPSULE | Freq: Four times a day (QID) | ORAL | Status: DC | PRN

## 2014-12-15 MED ORDER — ONDANSETRON HCL 4 MG/2ML IJ SOLN: 4.0000 mg | INTRAMUSCULAR | Status: DC | PRN

## 2014-12-15 MED ORDER — LANOLIN HYDROUS EX OINT: TOPICAL_OINTMENT | CUTANEOUS | Status: DC | PRN

## 2014-12-15 MED ORDER — LIDOCAINE HCL (PF) 1 % IJ SOLN
INTRAMUSCULAR | Status: DC | PRN
  Administered 2014-12-15: 5 mL
  Administered 2014-12-15: 3 mL
  Administered 2014-12-15: 5 mL

## 2014-12-15 MED ORDER — SIMETHICONE 80 MG PO CHEW
80.0000 mg | CHEWABLE_TABLET | ORAL | Status: DC | PRN
Start: 2014-12-15 — End: 2014-12-17

## 2014-12-15 MED ORDER — DIBUCAINE 1 % RE OINT
1.0000 "application " | TOPICAL_OINTMENT | RECTAL | Status: DC | PRN
  Filled 2014-12-15: qty 28

## 2014-12-15 MED ORDER — OXYCODONE-ACETAMINOPHEN 5-325 MG PO TABS: 1.0000 | ORAL_TABLET | ORAL | Status: DC | PRN

## 2014-12-15 MED ORDER — BENZOCAINE-MENTHOL 20-0.5 % EX AERO
1.0000 "application " | INHALATION_SPRAY | CUTANEOUS | Status: DC | PRN
  Administered 2014-12-15: 1 via TOPICAL
  Filled 2014-12-15: qty 56

## 2014-12-15 MED ORDER — WITCH HAZEL-GLYCERIN EX PADS: 1.0000 "application " | MEDICATED_PAD | CUTANEOUS | Status: DC | PRN

## 2014-12-15 MED ORDER — OXYCODONE-ACETAMINOPHEN 5-325 MG PO TABS: 2.0000 | ORAL_TABLET | ORAL | Status: DC | PRN

## 2014-12-15 MED ORDER — ZOLPIDEM TARTRATE 5 MG PO TABS: 5.0000 mg | ORAL_TABLET | Freq: Every evening | ORAL | Status: DC | PRN

## 2014-12-15 NOTE — Progress Notes (Signed)
Labor Progress Note  S: Pt seen w/ RN at bedside; feels much better now that epidural is in place  O:  BP 83/58 mmHg  Pulse 109  Temp(Src) 99.4 F (37.4 C) (Oral)  Resp 20  Ht  (1.702 m)  Wt 75.297 kg (166 lb)  BMI 25.99 kg/m2  SpO2 98%  LMP  (LMP Unknown) Cat I FHR 150s; mod var, +acels, -decels, contx q2-3 min CVE: Dilation: 10 Dilation Complete Date: 12/15/14 Dilation Complete Time: 0307 Effacement (%): 100 Cervical Position: Middle Station: -1 Presentation: Vertex Exam by:: Dr. Freida Busman    A&P: 24 y.o. G1P0 [redacted]w[redacted]d Patient making good progress since FB removal. Given cervical exam and bulging bag, will AROM at this time #discussed risk of infx and cord prolapse; mother consents to proceed w/ AROM #will try some pushing w/ RN; mother not feeling contx at this time #will monitor closely #expecting progression to normal vag delivery  Lowanda Foster, MD 3:13 AM

## 2014-12-15 NOTE — Progress Notes (Addendum)
Labor Progress Note  S: Pt now s/p FB and w/ epidural  O:  BP 120/61 mmHg  Pulse 105  Temp(Src) 99.4 F (37.4 C) (Oral)  Resp 20  Ht  (1.702 m)  Wt 75.297 kg (166 lb)  BMI 25.99 kg/m2  SpO2 98%  LMP  (LMP Unknown) Cat I; FHR 140s, mod var; -acels, -decels; contx q 2-3 min CVE: Dilation: 7 Effacement (%): 100 Cervical Position: Middle Station: -2 Presentation: Vertex Exam by:: Lanice Shirts RN   A&P: 24 y.o. G1P0 [redacted]w[redacted]d Pt now w/ FB out and w/ adequate contx. Will monitor for further progress q2 hrs. #recheck  #pit titration if contx become inadequate #expecting normal progression to vag delivery  Lowanda Foster, MD 1:29 AM

## 2014-12-15 NOTE — Anesthesia Preprocedure Evaluation (Signed)
Anesthesia Evaluation  Patient identified by MRN, date of birth, ID band Patient awake    Reviewed: Allergy & Precautions, H&P , NPO status , Patient's Chart, lab work & pertinent test results  History of Anesthesia Complications Negative for: history of anesthetic complications  Airway Mallampati: II  TM Distance: >3 FB Neck ROM: full    Dental no notable dental hx. (+) Teeth Intact   Pulmonary neg pulmonary ROS,  breath sounds clear to auscultation  Pulmonary exam normal       Cardiovascular negative cardio ROS Normal cardiovascular examRhythm:regular Rate:Normal     Neuro/Psych negative neurological ROS  negative psych ROS   GI/Hepatic negative GI ROS, Neg liver ROS,   Endo/Other  negative endocrine ROS  Renal/GU negative Renal ROS  negative genitourinary   Musculoskeletal   Abdominal   Peds  Hematology negative hematology ROS (+)   Anesthesia Other Findings   Reproductive/Obstetrics (+) Pregnancy                             Anesthesia Physical Anesthesia Plan  ASA: II  Anesthesia Plan: Epidural   Post-op Pain Management:    Induction:   Airway Management Planned:   Additional Equipment:   Intra-op Plan:   Post-operative Plan:   Informed Consent: I have reviewed the patients History and Physical, chart, labs and discussed the procedure including the risks, benefits and alternatives for the proposed anesthesia with the patient or authorized representative who has indicated his/her understanding and acceptance.     Plan Discussed with:   Anesthesia Plan Comments:         Anesthesia Quick Evaluation  

## 2014-12-15 NOTE — Anesthesia Postprocedure Evaluation (Signed)
  Anesthesia Post-op Note  Patient: Gail Johnson  Procedure(s) Performed: * No procedures listed *  Patient Location: Mother/Baby  Anesthesia Type:Epidural  Level of Consciousness: awake, alert , oriented and patient cooperative  Airway and Oxygen Therapy: Patient Spontanous Breathing  Post-op Pain: none  Post-op Assessment: Post-op Vital signs reviewed, Patient's Cardiovascular Status Stable, Respiratory Function Stable, Patent Airway, No headache, No backache and Patient able to bend at knees              Post-op Vital Signs: Reviewed and stable  Last Vitals:  Filed Vitals:   12/15/14 1217  BP: 121/82  Pulse: 61  Temp: 37.1 C  Resp: 20    Complications: No apparent anesthesia complications

## 2014-12-15 NOTE — Lactation Note (Signed)
This note was copied from the chart of Gail Danelle Berry. Lactation Consultation Note  Baby is asleep and did not waked when she was positioned for BF.  Mother reports that baby is BF well.  Mom taught hand expression with colostrum visible.  Information given on support groups and outpatient services.  Follow-up tomorrow.  Patient Name: Gail Johnson Today's Date: 12/15/2014 Reason for consult: Initial assessment   Maternal Data Has patient been taught Hand Expression?: Yes Does the patient have breastfeeding experience prior to this delivery?: No  Feeding Feeding Type: Breast Fed Length of feed: 20 min  LATCH Score/Interventions Latch: Too sleepy or reluctant, no latch achieved, no sucking elicited.  Audible Swallowing: None  Type of Nipple: Everted at rest and after stimulation  Comfort (Breast/Nipple): Soft / non-tender     Hold (Positioning): Assistance needed to correctly position infant at breast and maintain latch.  LATCH Score: 5  Lactation Tools Discussed/Used     Consult Status      Soyla Dryer 12/15/2014, 5:41 PM

## 2014-12-15 NOTE — Anesthesia Procedure Notes (Signed)
Epidural Patient location during procedure: OB  Staffing Anesthesiologist: Phillips Grout Performed by: anesthesiologist   Preanesthetic Checklist Completed: patient identified, site marked, surgical consent, pre-op evaluation, timeout performed, IV checked, risks and benefits discussed and monitors and equipment checked  Epidural Patient position: sitting Prep: DuraPrep Patient monitoring: heart rate, continuous pulse ox and blood pressure Approach: right paramedian Location: L4-L5 Injection technique: LOR saline  Needle:  Needle type: Tuohy  Needle gauge: 17 G Needle length: 9 cm and 9 Needle insertion depth: 4 cm Catheter type: closed end flexible Catheter size: 20 Guage Catheter at skin depth: 9 cm Test dose: negative  Assessment Events: blood not aspirated, injection not painful, no injection resistance, negative IV test and no paresthesia  Additional Notes Patient identified. Risks/Benefits/Options discussed with patient including but not limited to bleeding, infection, nerve damage, paralysis, failed block, incomplete pain control, headache, blood pressure changes, nausea, vomiting, reactions to medication both or allergic, itching and postpartum back pain. Confirmed with bedside nurse the patient's most recent platelet count. Confirmed with patient that they are not currently taking any anticoagulation, have any bleeding history or any family history of bleeding disorders. Patient expressed understanding and wished to proceed. All questions were answered. Sterile technique was used throughout the entire procedure. Please see nursing notes for vital signs. Test dose was given through epidural needle and negative prior to continuing to dose epidural or start infusion. Warning signs of high block given to the patient including shortness of breath, tingling/numbness in hands, complete motor block, or any concerning symptoms with instructions to call for help. Patient was given  instructions on fall risk and not to get out of bed. All questions and concerns addressed with instructions to call with any issues.

## 2014-12-16 DIAGNOSIS — Z3A41 41 weeks gestation of pregnancy: Secondary | ICD-10-CM

## 2014-12-16 DIAGNOSIS — O480 Post-term pregnancy: Secondary | ICD-10-CM

## 2014-12-16 LAB — CBC
HCT: 28.4 % — ABNORMAL LOW (ref 36.0–46.0)
Hemoglobin: 9.6 g/dL — ABNORMAL LOW (ref 12.0–15.0)
MCH: 27.5 pg (ref 26.0–34.0)
MCHC: 33.8 g/dL (ref 30.0–36.0)
MCV: 81.4 fL (ref 78.0–100.0)
Platelets: 218 10*3/uL (ref 150–400)
RBC: 3.49 MIL/uL — ABNORMAL LOW (ref 3.87–5.11)
RDW: 15.1 % (ref 11.5–15.5)
WBC: 15.7 10*3/uL — ABNORMAL HIGH (ref 4.0–10.5)

## 2014-12-16 NOTE — Lactation Note (Signed)
This note was copied from the chart of Gail Danelle Berry. Lactation Consultation Note  Baby is at the breast often but falls asleep quickly.  Her latch is deep but she does not have long jaw excursions.  Mom's breasts are filling at  35 hours and it is likely that baby is not transferring.  Mom was set-up with a double electric breast pump to support lactation.  She did not express any milk with the pump and it is difficult to hand express milk from her.  Recommended that she continue breast feeding and use breast massage and compression to keep baby engaged.  Encouraged pumping after every BF.  Follow-up tomorrow.  Baby may need some formula later but I did not discuss with parents at this point as mom may be able to express colostrum soon.  Patient Name: Gail Johnson Today's Date: 12/16/2014     Maternal Data    Feeding Feeding Type: Breast Fed Length of feed: 20 min (Baby did not swallow and comes off hungry)  LATCH Score/Interventions Latch: Repeated attempts needed to sustain latch, nipple held in mouth throughout feeding, stimulation needed to elicit sucking reflex. Intervention(s): Skin to skin;Teach feeding cues;Waking techniques  Audible Swallowing: None Intervention(s): Skin to skin Intervention(s): Skin to skin;Hand expression  Type of Nipple: Everted at rest and after stimulation  Comfort (Breast/Nipple): Filling, red/small blisters or bruises, mild/mod discomfort     Hold (Positioning): Assistance needed to correctly position infant at breast and maintain latch. Intervention(s): Breastfeeding basics reviewed;Support Pillows;Position options;Skin to skin  LATCH Score: 5  Lactation Tools Discussed/Used     Consult Status      Soyla Dryer 12/16/2014, 5:29 PM

## 2014-12-16 NOTE — Progress Notes (Signed)
Post Partum Day 1 Subjective:  Gail Johnson is a 24 y.o. G1P1001 [redacted]w[redacted]d s/p NSVD after PDIOL.  No acute events overnight.  Pt denies problems with ambulating, voiding or po intake.  She denies nausea or vomiting.  Pain is well controlled.  She has had flatus. She has not had bowel movement.  Lochia Moderate.  Plan for birth control is Depo-Provera.  Method of Feeding: breast  Objective: Blood pressure 109/61, pulse 85, temperature 98 F (36.7 C), temperature source Oral, resp. rate 19, height  (1.702 m), weight 166 lb (75.297 kg), SpO2 100 %, unknown if currently breastfeeding.  Physical Exam:  General: alert, cooperative and no distress Lochia:normal flow Chest: CTAB Heart: RRR no m/r/g Abdomen: +BS, soft, nontender,  Uterine Fundus: firm, below umbilicus DVT Evaluation: No evidence of DVT seen on physical exam. Extremities: no edema   Recent Labs  12/14/14 0815 12/16/14 0500  HGB 11.7* 9.6*  HCT 34.9* 28.4*    Assessment/Plan:  ASSESSMENT: Gail Johnson is a 24 y.o. G1P1001 [redacted]w[redacted]d s/p NSVD  Plan for discharge tomorrow, Breastfeeding and Contraception Depo vs pills.    LOS: 2 days   Federico Flake 12/16/2014, 6:41 AM

## 2014-12-17 ENCOUNTER — Telehealth: Payer: Self-pay | Admitting: Women's Health

## 2014-12-17 MED ORDER — IBUPROFEN 600 MG PO TABS
600.0000 mg | ORAL_TABLET | Freq: Four times a day (QID) | ORAL | Status: DC
Start: 2014-12-17 — End: 2015-01-21

## 2014-12-17 NOTE — Progress Notes (Signed)
Discharge instructions reviewed with patient.  Patient states understanding of home care for self and baby, medications, activity, signs/symptoms to report to MD and return MD office visit.  Patients significant other and family will assist with her care @ home.  No home  equipment needed, patient has prescriptions and all personal belongings.  Patient ambulated for discharge in stable condition with staff without incident.   

## 2014-12-17 NOTE — Discharge Summary (Signed)
Obstetric Discharge Summary Reason for Admission: induction of labor Prenatal Procedures: none Intrapartum Procedures: spontaneous vaginal delivery Postpartum Procedures: none Complications-Operative and Postpartum: none  Delivery Note At 5:32 AM a viable female was delivered via (Presentation: ROA). APGAR: per nursing documentation; weight pending.  Placenta status: intact, complete. Cord: 2 vessel with the following complications: none.   Anesthesia: epidural Episiotomy: none Lacerations: none Est. Blood Loss (mL): 501  Mom to postpartum. Baby to Couplet care / Skin to Skin.   Hospital Course:  Active Problems:   Post-dates pregnancy   Gail Johnson is a 24 y.o. G1P1001 s/p SVD.  Patient was admitted 8/7.  She has postpartum course that was uncomplicated including no problems with ambulating, PO intake, urination, pain, or bleeding. The pt feels ready to go home and  will be discharged with outpatient follow-up.   Today: No acute events overnight.  Pt denies problems with ambulating, voiding or po intake.  She denies nausea or vomiting.  Pain is well controlled.  She has had flatus. She has had bowel movement.  Lochia Minimal.  Plan for birth control is  Depo-Provera.  Method of Feeding: breast  Physical Exam:  General: alert, cooperative, appears stated age and no distress Lochia: appropriate Uterine Fundus: firm DVT Evaluation: No evidence of DVT seen on physical exam.  H/H: Lab Results  Component Value Date/Time   HGB 9.6* 12/16/2014 05:00 AM   HCT 28.4* 12/16/2014 05:00 AM   HCT 30.4* 09/10/2014 09:44 AM    Discharge Diagnoses: Term Pregnancy-delivered  Discharge Information: Date: 12/17/2014 Activity: pelvic rest Diet: routine  Medications: PNV and Ibuprofen Breast feeding:  Yes Condition: stable Instructions: refer to handout Discharge to: home       Discharge Instructions    Discharge patient    Complete by:  As directed              Medication List    TAKE these medications        acyclovir 400 MG tablet  Commonly known as:  ZOVIRAX  Take 1 tablet (400 mg total) by mouth 3 (three) times daily.     ferrous sulfate 325 (65 FE) MG tablet  Take 1 tablet (325 mg total) by mouth 2 (two) times daily with a meal.     ibuprofen 600 MG tablet  Commonly known as:  ADVIL,MOTRIN  Take 1 tablet (600 mg total) by mouth every 6 (six) hours.     PNV PRENATAL PLUS MULTIVITAMIN 27-1 MG Tabs  Take 1 tablet by mouth daily.       Follow-up Information    Go to [facility].   Why:  for postpartum visit   Contact information:   [address] [place] [address] [phone]      Lowanda Foster ,MD 12/17/2014,6:38 AM  OB FELLOW DISCHARGE ATTESTATION  I have seen and examined this patient and agree with above documentation in the resident's note.  Silvano Bilis, MD 7:29 AM

## 2014-12-17 NOTE — Discharge Instructions (Signed)

## 2014-12-22 ENCOUNTER — Telehealth: Payer: Self-pay | Admitting: *Deleted

## 2014-12-22 NOTE — Telephone Encounter (Signed)
Margie from [employer], Pt disability company, requesting date and type of delivery. Informed pt delivered on 12/15/2014 SVD.

## 2015-01-19 ENCOUNTER — Encounter: Payer: Self-pay | Admitting: Women's Health

## 2015-01-19 ENCOUNTER — Ambulatory Visit (INDEPENDENT_AMBULATORY_CARE_PROVIDER_SITE_OTHER): Payer: 59 | Admitting: Women's Health

## 2015-01-19 ENCOUNTER — Ambulatory Visit: Payer: 59

## 2015-01-19 MED ORDER — MEDROXYPROGESTERONE ACETATE 150 MG/ML IM SUSP: 150.0000 mg | INTRAMUSCULAR | Status: DC

## 2015-01-19 NOTE — Patient Instructions (Signed)
Tips To Increase Milk Supply  Lots of water! Enough so that your urine is clear  Plenty of calories, if you're not getting enough calories, your milk supply can decrease  Breastfeed/pump often, every 2-3 hours x 20-2mins  Fenugreek 3 pills 3 times a day, this may make your urine smell like maple syrup  Mother's Milk Tea  Lactation cookies, google for the recipe  Real oatmeal   Medroxyprogesterone injection [Contraceptive] What is this medicine? MEDROXYPROGESTERONE (me DROX ee proe JES te rone) contraceptive injections prevent pregnancy. They provide effective birth control for 3 months. Depo-subQ Provera 104 is also used for treating pain related to endometriosis. This medicine may be used for other purposes; ask your health care provider or pharmacist if you have questions. COMMON BRAND NAME(S): Depo-Provera, Depo-subQ Provera 104 What should I tell my health care provider before I take this medicine? They need to know if you have any of these conditions: -frequently drink alcohol -asthma -blood vessel disease or a history of a blood clot in the lungs or legs -bone disease such as osteoporosis -breast cancer -diabetes -eating disorder (anorexia nervosa or bulimia) -high blood pressure -HIV infection or AIDS -kidney disease -liver disease -mental depression -migraine -seizures (convulsions) -stroke -tobacco smoker -vaginal bleeding -an unusual or allergic reaction to medroxyprogesterone, other hormones, medicines, foods, dyes, or preservatives -pregnant or trying to get pregnant -breast-feeding How should I use this medicine? Depo-Provera Contraceptive injection is given into a muscle. Depo-subQ Provera 104 injection is given under the skin. These injections are given by a health care professional. You must not be pregnant before getting an injection. The injection is usually given during the first 5 days after the start of a menstrual period or 6 weeks after delivery of  a baby. Talk to your pediatrician regarding the use of this medicine in children. Special care may be needed. These injections have been used in female children who have started having menstrual periods. Overdosage: If you think you have taken too much of this medicine contact a poison control center or emergency room at once. NOTE: This medicine is only for you. Do not share this medicine with others. What if I miss a dose? Try not to miss a dose. You must get an injection once every 3 months to maintain birth control. If you cannot keep an appointment, call and reschedule it. If you wait longer than 13 weeks between Depo-Provera contraceptive injections or longer than 14 weeks between Depo-subQ Provera 104 injections, you could get pregnant. Use another method for birth control if you miss your appointment. You may also need a pregnancy test before receiving another injection. What may interact with this medicine? Do not take this medicine with any of the following medications: -bosentan This medicine may also interact with the following medications: -aminoglutethimide -antibiotics or medicines for infections, especially rifampin, rifabutin, rifapentine, and griseofulvin -aprepitant -barbiturate medicines such as phenobarbital or primidone -bexarotene -carbamazepine -medicines for seizures like ethotoin, felbamate, oxcarbazepine, phenytoin, topiramate -modafinil -St. John's wort This list may not describe all possible interactions. Give your health care provider a list of all the medicines, herbs, non-prescription drugs, or dietary supplements you use. Also tell them if you smoke, drink alcohol, or use illegal drugs. Some items may interact with your medicine. What should I watch for while using this medicine? This drug does not protect you against HIV infection (AIDS) or other sexually transmitted diseases. Use of this product may cause you to lose calcium from your bones. Loss of calcium  may cause weak bones (osteoporosis). Only use this product for more than 2 years if other forms of birth control are not right for you. The longer you use this product for birth control the more likely you will be at risk for weak bones. Ask your health care professional how you can keep strong bones. You may have a change in bleeding pattern or irregular periods. Many females stop having periods while taking this drug. If you have received your injections on time, your chance of being pregnant is very low. If you think you may be pregnant, see your health care professional as soon as possible. Tell your health care professional if you want to get pregnant within the next year. The effect of this medicine may last a long time after you get your last injection. What side effects may I notice from receiving this medicine? Side effects that you should report to your doctor or health care professional as soon as possible: -allergic reactions like skin rash, itching or hives, swelling of the face, lips, or tongue -breast tenderness or discharge -breathing problems -changes in vision -depression -feeling faint or lightheaded, falls -fever -pain in the abdomen, chest, groin, or leg -problems with balance, talking, walking -unusually weak or tired -yellowing of the eyes or skin Side effects that usually do not require medical attention (report to your doctor or health care professional if they continue or are bothersome): -acne -fluid retention and swelling -headache -irregular periods, spotting, or absent periods -temporary pain, itching, or skin reaction at site where injected -weight gain This list may not describe all possible side effects. Call your doctor for medical advice about side effects. You may report side effects to FDA at 1-800-FDA-1088. Where should I keep my medicine? This does not apply. The injection will be given to you by a health care professional. NOTE: This sheet is a  summary. It may not cover all possible information. If you have questions about this medicine, talk to your doctor, pharmacist, or health care provider.  2015, Elsevier/Gold Standard. (2008-05-16 18:37:56)

## 2015-01-19 NOTE — Progress Notes (Signed)
Patient ID: Gail Johnson, female   DOB: April 01, 1991, 24 y.o.   MRN: [id number] Subjective:    Gail Johnson is a 24 y.o. G76P1001 African American female who presents for a postpartum visit. She is 4 weeks postpartum following a spontaneous vaginal delivery at 41+ gestational weeks after IOL for postdates. Anesthesia: epidural. I have fully reviewed the prenatal and intrapartum course. Postpartum course has been uncomplicated. Baby's course has been uncomplicated. Baby is feeding by breast/bottle, feels like milk supply has decreased. Bleeding has mainly stopped, occ spotting. Bowel function is normal. Bladder function is normal. Patient is sexually active. Last sexual activity: last week, used condom. Contraception method is condoms and wants depo. Postpartum depression screening: negative. Score 2.  Last pap 06/05/14 and was ASCUS w/ -HRHPV, per ASCCP guidelines can repeat in 4yrs, pt prefers to repeat in 75yr.  The following portions of the patient's history were reviewed and updated as appropriate: allergies, current medications, past medical history, past surgical history and problem list.  Review of Systems Pertinent items are noted in HPI.   Filed Vitals:   01/19/15 0924  BP: 100/68  Pulse: 60  Height:  (1.676 m)  Weight: 136 lb (61.689 kg)   No LMP recorded.  Objective:   General:  alert, cooperative and no distress   Breasts:  deferred, no complaints  Lungs: clear to auscultation bilaterally  Heart:  regular rate and rhythm  Abdomen: soft, nontender   Vulva: normal  Vagina: normal vagina  Cervix:  closed  Corpus: Well-involuted  Adnexa:  Non-palpable  Rectal Exam: No hemorrhoids        Assessment:   Postpartum exam 4 wks s/p SVB after IOL for postdates Breast/bottlefeeding Depression screening Contraception counseling   Plan:  Gave printed info to help increase milk supply Contraception: rx depo, return today for injection, condoms x 2wks Follow up in: today  for depo, then after 06/06/15 for pap & physical  Marge Duncans CNM, [facility] 01/19/2015 9:56 AM

## 2015-01-21 ENCOUNTER — Ambulatory Visit (INDEPENDENT_AMBULATORY_CARE_PROVIDER_SITE_OTHER): Payer: 59 | Admitting: *Deleted

## 2015-01-21 ENCOUNTER — Encounter: Payer: Self-pay | Admitting: *Deleted

## 2015-01-21 DIAGNOSIS — Z3042 Encounter for surveillance of injectable contraceptive: Secondary | ICD-10-CM | POA: Diagnosis not present

## 2015-01-21 DIAGNOSIS — Z3202 Encounter for pregnancy test, result negative: Secondary | ICD-10-CM | POA: Diagnosis not present

## 2015-01-21 LAB — POCT URINE PREGNANCY: PREG TEST UR: NEGATIVE

## 2015-01-21 MED ORDER — MEDROXYPROGESTERONE ACETATE 150 MG/ML IM SUSP
150.0000 mg | Freq: Once | INTRAMUSCULAR | Status: AC
  Administered 2015-01-21: 150 mg via INTRAMUSCULAR

## 2015-01-21 NOTE — Progress Notes (Signed)
Pt here for Depo. Reports no problems at this time. Return in 12 weeks for next shot. JSY 

## 2015-01-27 ENCOUNTER — Telehealth: Payer: Self-pay | Admitting: Obstetrics & Gynecology

## 2015-01-27 NOTE — Telephone Encounter (Signed)
Duplicate message. 

## 2015-01-27 NOTE — Telephone Encounter (Signed)
Pt requesting a note to return to work on 01/29/2015, saw Joellyn Haff, CNM on 01/19/2015 for postpartum visit. Note completed and faxed to [phone] per pt request.

## 2015-04-01 ENCOUNTER — Encounter (HOSPITAL_COMMUNITY): Payer: Self-pay | Admitting: Emergency Medicine

## 2015-04-01 ENCOUNTER — Emergency Department (INDEPENDENT_AMBULATORY_CARE_PROVIDER_SITE_OTHER)
Admission: EM | Admit: 2015-04-01 | Discharge: 2015-04-01 | Disposition: A | Discharge status: Home / Self Care | Payer: BLUE CROSS/BLUE SHIELD | Source: Home / Self Care

## 2015-04-01 DIAGNOSIS — N390 Urinary tract infection, site not specified: Secondary | ICD-10-CM | POA: Diagnosis not present

## 2015-04-01 LAB — POCT URINALYSIS DIP (DEVICE)
BILIRUBIN URINE: NEGATIVE
GLUCOSE, UA: NEGATIVE mg/dL
KETONES UR: 80 mg/dL — AB
Nitrite: POSITIVE — AB
Protein, ur: >=300 mg/dL — AB
Urobilinogen, UA: 0.2 mg/dL (ref 0.0–1.0)
pH: 6 (ref 5.0–8.0)

## 2015-04-01 MED ORDER — PHENAZOPYRIDINE HCL 200 MG PO TABS: 200.0000 mg | ORAL_TABLET | Freq: Three times a day (TID) | ORAL | Status: DC

## 2015-04-01 MED ORDER — NITROFURANTOIN MONOHYD MACRO 100 MG PO CAPS: 100.0000 mg | ORAL_CAPSULE | Freq: Two times a day (BID) | ORAL | Status: DC

## 2015-04-01 NOTE — Discharge Instructions (Signed)

## 2015-04-01 NOTE — ED Notes (Signed)
[facility] a [facility] of lower back pain, urinary frequency, and hematuria that has been ongoing for 1 week.

## 2015-04-01 NOTE — ED Provider Notes (Signed)
CSN: [id number]     Arrival date & time 04/01/15  1422 History   None    Chief Complaint  Patient presents with  . Back Pain  . Urinary Frequency  . Hematuria   (Consider location/radiation/quality/duration/timing/severity/associated sxs/prior Treatment) HPI History obtained from patient:   LOCATION:bladder SEVERITY: DURATION:2-3 days CONTEXT:sudden onset last Saturday, frequency, sensation of not emptying bladder QUALITY:burning MODIFYING FACTORS: none ASSOCIATED SYMPTOMS:back pain TIMING:constant OCCUPATION: [employer]  Past Medical History  Diagnosis Date  . HSV-2 infection   . Multiple allergies     gts hives  . Chlamydia   . Vaginal discharge 07/04/2014  . BV (bacterial vaginosis) 07/04/2014   Past Surgical History  Procedure Laterality Date  . No past surgeries     Family History  Problem Relation Age of Onset  . Hypertension Maternal Grandmother   . Heart disease Maternal Grandfather   . Hypertension Paternal Grandmother   . Diabetes Paternal Grandmother    Social History  Substance Use Topics  . Smoking status: Never Smoker   . Smokeless tobacco: Never Used  . Alcohol Use: No   OB History    Gravida Para Term Preterm AB TAB SAB Ectopic Multiple Living   1 1 1       0 1     Review of Systems ROS +'ve bladder, back pain, dysuria  Denies: HEADACHE, NAUSEA, ABDOMINAL PAIN, CHEST PAIN, CONGESTION,  SHORTNESS OF BREATH  Allergies  Orange fruit and Shellfish allergy  Home Medications   Prior to Admission medications   Medication Sig Start Date End Date Taking? Authorizing Provider  acyclovir (ZOVIRAX) 400 MG tablet Take 1 tablet (400 mg total) by mouth 3 (three) times daily. Patient taking differently: Take 400 mg by mouth as needed.  10/08/14   Jacklyn ShellFrances Cresenzo-Dishmon, CNM  medroxyPROGESTERone (DEPO-PROVERA) 150 MG/ML injection Inject 1 mL (150 mg total) into the muscle every 3 (three) months. 01/19/15   Cheral MarkerKimberly R Booker, CNM  Prenatal Vit-Fe  Fumarate-FA (PNV PRENATAL PLUS MULTIVITAMIN) 27-1 MG TABS Take 1 tablet by mouth daily. 08/20/14   Jacklyn ShellFrances Cresenzo-Dishmon, CNM   Meds Ordered and Administered this Visit  Medications - No data to display  There were no vitals taken for this visit. No data found.   Physical Exam  Constitutional: She is oriented to person, place, and time. She appears well-developed and well-nourished. No distress.  HENT:  Head: Normocephalic and atraumatic.  Right Ear: External ear normal.  Left Ear: External ear normal.  Mouth/Throat: Oropharynx is clear and moist.  Pulmonary/Chest: Effort normal and breath sounds normal.  Abdominal: Soft. Bowel sounds are normal. She exhibits no distension. There is no tenderness. There is no rebound.  Musculoskeletal: Normal range of motion.  Neurological: She is alert and oriented to person, place, and time.  Skin: Skin is warm and dry.  Psychiatric: She has a normal mood and affect. Her behavior is normal. Judgment and thought content normal.  Nursing note and vitals reviewed.   ED Course  Procedures (including critical care time)  Labs Review Labs Reviewed  POCT URINALYSIS DIP (DEVICE) - Abnormal; Notable for the following:    Ketones, ur 80 (*)    Hgb urine dipstick LARGE (*)    Protein, ur >=300 (*)    Nitrite POSITIVE (*)    Leukocytes, UA TRACE (*)    All other components within normal limits    Imaging Review No results found.   Visual Acuity Review  Right Eye Distance:   Left Eye Distance:  Bilateral Distance:    Right Eye Near:   Left Eye Near:    Bilateral Near:         MDM   1. UTI (lower urinary tract infection)    I have discussed positive urine with patient. Macrobid antibiotic will be started. Also Pyridium for bladder spasm. Instructions of care provided discharged home in stable condition.    Tharon Aquas, PA 04/01/15 1626

## 2015-04-15 ENCOUNTER — Encounter: Payer: Self-pay | Admitting: Obstetrics and Gynecology

## 2015-04-15 ENCOUNTER — Ambulatory Visit (INDEPENDENT_AMBULATORY_CARE_PROVIDER_SITE_OTHER): Payer: 59 | Admitting: Obstetrics and Gynecology

## 2015-04-15 ENCOUNTER — Ambulatory Visit: Payer: 59

## 2015-04-15 VITALS — BP 120/76 | Ht 66.0 in | Wt 139.0 lb

## 2015-04-15 DIAGNOSIS — Z3009 Encounter for other general counseling and advice on contraception: Secondary | ICD-10-CM | POA: Diagnosis not present

## 2015-04-15 DIAGNOSIS — N390 Urinary tract infection, site not specified: Secondary | ICD-10-CM | POA: Diagnosis not present

## 2015-04-15 LAB — POCT URINALYSIS DIPSTICK
GLUCOSE UA: NEGATIVE
Ketones, UA: NEGATIVE
Nitrite, UA: POSITIVE
Protein, UA: NEGATIVE

## 2015-04-15 MED ORDER — NORETHINDRONE 0.35 MG PO TABS: 1.0000 | ORAL_TABLET | Freq: Every day | ORAL | Status: DC

## 2015-04-15 NOTE — Progress Notes (Signed)
Patient ID: Gail HertzJasmine D Euceda, female   DOB: 1990/12/30, 24 y.o.   MRN: [id number] Pt here today for follow up on UTI. Urine shows nitrates today, will send for culture. Pt also wants to discuss BC options.

## 2015-04-15 NOTE — Progress Notes (Signed)
   [facility] Visit  Patient name: Gail Johnson Kilroy MRN [id number]  Date of birth: 1991-04-01  CC & HPI:  Gail Johnson Brummell is a 24 y.o. female presenting today for follow-up of UTI and discussion of BC options.  [facility] and diagnosed with a UTI. She was given a 1-week course of Macrobid, which she finished. She states she has still been having dysuria.  Patient wants to switch from Depo injections to starting OCP's. She states she is scheduled for her next Depo injection today. She states she is still breastfeeding.    ROS:  A complete 10 system review of systems was obtained and all systems are negative except as noted in the HPI and PMH.    Pertinent History Reviewed:   Reviewed: Significant for n/a Medical         Past Medical History  Diagnosis Date  . HSV-2 infection   . Multiple allergies     gts hives  . Chlamydia   . Vaginal discharge 07/04/2014  . BV (bacterial vaginosis) 07/04/2014                              Surgical Hx:    Past Surgical History  Procedure Laterality Date  . No past surgeries     Medications: Reviewed & Updated - see associated section                       Current outpatient prescriptions:  .  medroxyPROGESTERone (DEPO-PROVERA) 150 MG/ML injection, Inject 1 mL (150 mg total) into the muscle every 3 (three) months., Disp: 1 mL, Rfl: 3 .  Prenatal Vit-Fe Fumarate-FA (PNV PRENATAL PLUS MULTIVITAMIN) 27-1 MG TABS, Take 1 tablet by mouth daily., Disp: 30 tablet, Rfl: 11   Social History: Reviewed -  reports that she has never smoked. She has never used smokeless tobacco.  Objective Findings:  Vitals: Blood pressure 120/76, height 5\' 6"  (1.676 m), weight 139 lb (63.05 kg), currently breastfeeding.  Physical Examination:  Discussed with pt risks and benefits of various OCP's. Pt had opportunity to ask questions and has no further questions at this time.   Greater than 50% was spent in counseling and coordination of care with the  patient. Discussion time: >10 minutes. Total time >15 min   Assessment & Plan:   A:  1. Follow-up on UTI. Pt still has dysuria despite 1-week course of Macrobid.  2. Birth control management. Pt currently breastfeeding. 3 P:  1. Will send urine culture.  2. Start Micronor BID.   By signing my name below, I, Ronney LionSuzanne Le, attest that this documentation has been prepared under the direction and in the presence of Tilda BurrowJohn V Dayzha Pogosyan, MD. Electronically Signed: Ronney LionSuzanne Le, ED Scribe. 04/15/2015. 12:20 PM.   I personally performed the services described in this documentation, which was SCRIBED in my presence. The recorded information has been reviewed and considered accurate. It has been edited as necessary during review. Tilda BurrowFERGUSON,Katelin Kutsch V, MD  (scribe attestation statement)

## 2015-04-17 ENCOUNTER — Telehealth: Payer: Self-pay | Admitting: Obstetrics and Gynecology

## 2015-04-17 LAB — URINE CULTURE

## 2015-04-17 MED ORDER — CEPHALEXIN 500 MG PO CAPS: 500.0000 mg | ORAL_CAPSULE | Freq: Four times a day (QID) | ORAL | Status: DC

## 2015-04-17 NOTE — Telephone Encounter (Signed)
rx keflex

## 2015-04-17 NOTE — Telephone Encounter (Signed)
Pt informed urine culture from 04/15/2015 Positive for UTI.

## 2015-04-18 ENCOUNTER — Other Ambulatory Visit: Payer: Self-pay | Admitting: Obstetrics and Gynecology

## 2015-04-18 ENCOUNTER — Telehealth: Payer: Self-pay | Admitting: Obstetrics and Gynecology

## 2015-04-18 ENCOUNTER — Encounter: Payer: Self-pay | Admitting: Obstetrics and Gynecology

## 2015-04-18 DIAGNOSIS — O2340 Unspecified infection of urinary tract in pregnancy, unspecified trimester: Secondary | ICD-10-CM | POA: Insufficient documentation

## 2015-04-18 MED ORDER — NITROFURANTOIN MONOHYD MACRO 100 MG PO CAPS: 100.0000 mg | ORAL_CAPSULE | Freq: Two times a day (BID) | ORAL | Status: AC

## 2015-04-18 NOTE — Telephone Encounter (Signed)
Pt left a msg to pick up Macrodantin

## 2015-05-25 ENCOUNTER — Telehealth: Payer: Self-pay | Admitting: Obstetrics & Gynecology

## 2015-05-27 ENCOUNTER — Other Ambulatory Visit: Payer: 59 | Admitting: Obstetrics and Gynecology

## 2015-05-29 NOTE — Telephone Encounter (Signed)
Pt states her insurance did pay for her PNV.

## 2015-10-28 ENCOUNTER — Other Ambulatory Visit: Payer: Self-pay | Admitting: Advanced Practice Midwife

## 2015-10-31 ENCOUNTER — Encounter (HOSPITAL_COMMUNITY): Payer: Self-pay | Admitting: *Deleted

## 2015-10-31 ENCOUNTER — Emergency Department (HOSPITAL_COMMUNITY)
Admission: EM | Admit: 2015-10-31 | Discharge: 2015-10-31 | Disposition: A | Discharge status: Home / Self Care | Payer: 59 | Attending: Emergency Medicine | Admitting: Emergency Medicine

## 2015-10-31 DIAGNOSIS — L509 Urticaria, unspecified: Secondary | ICD-10-CM | POA: Diagnosis not present

## 2015-10-31 DIAGNOSIS — T7840XA Allergy, unspecified, initial encounter: Secondary | ICD-10-CM | POA: Diagnosis present

## 2015-10-31 MED ORDER — METHYLPREDNISOLONE SODIUM SUCC 125 MG IJ SOLR
125.0000 mg | Freq: Once | INTRAMUSCULAR | Status: AC
  Administered 2015-10-31: 125 mg via INTRAVENOUS
  Filled 2015-10-31: qty 2

## 2015-10-31 MED ORDER — SODIUM CHLORIDE 0.9 % IV SOLN
INTRAVENOUS | Status: DC
  Administered 2015-10-31: 20:00:00 via INTRAVENOUS

## 2015-10-31 MED ORDER — DIPHENHYDRAMINE HCL 25 MG PO TABS
25.0000 mg | ORAL_TABLET | Freq: Four times a day (QID) | ORAL | Status: DC
Start: 2015-10-31 — End: 2016-12-30

## 2015-10-31 MED ORDER — SODIUM CHLORIDE 0.9 % IV BOLUS (SEPSIS)
500.0000 mL | Freq: Once | INTRAVENOUS | Status: AC
  Administered 2015-10-31: 500 mL via INTRAVENOUS

## 2015-10-31 MED ORDER — DIPHENHYDRAMINE HCL 50 MG/ML IJ SOLN
25.0000 mg | Freq: Once | INTRAMUSCULAR | Status: AC
  Administered 2015-10-31: 25 mg via INTRAVENOUS
  Filled 2015-10-31: qty 1

## 2015-10-31 MED ORDER — PREDNISONE 10 MG PO TABS: 40.0000 mg | ORAL_TABLET | Freq: Every day | ORAL | Status: DC

## 2015-10-31 NOTE — Discharge Instructions (Signed)
Follow-up with her primary care doctor as needed. Keep your appointment to follow-up with allergy. Take the prednisone for the next 5 days. Take Benadryl every 6 hours for the next 2-3 days then as needed. Continue to take your other medications as directed. Return for tongue swelling trouble breathing loss of voice.

## 2015-10-31 NOTE — ED Provider Notes (Signed)
CSN: [id number]     Arrival date & time 10/31/15  1754 History   First MD Initiated Contact with Patient 10/31/15 1820     Chief Complaint  Patient presents with  . Allergic Reaction     (Consider location/radiation/quality/duration/timing/severity/associated sxs/prior Treatment) Patient is a 25 y.o. female presenting with allergic reaction. The history is provided by the patient.  Allergic Reaction Presenting symptoms: difficulty swallowing and rash   Presenting symptoms: no wheezing    Patient with one-month history of hives been present to some degree throughout that entire time. Patient has been seen by dermatology and allergy as well as her primary care doctor. Patient finished a course of prednisone today to 10 mg. Patient continues to take Tagamet. Patient never was started on Benadryl. Patient feels that the hives got worse she had upper lip swelling and she felt a tightness in the lower part of her throat. No wheezing. So far they have not been able to determine the cause of the hives. Past Medical History  Diagnosis Date  . HSV-2 infection   . Multiple allergies     gts hives  . Chlamydia   . Vaginal discharge 07/04/2014  . BV (bacterial vaginosis) 07/04/2014   Past Surgical History  Procedure Laterality Date  . No past surgeries     Family History  Problem Relation Age of Onset  . Hypertension Maternal Grandmother   . Heart disease Maternal Grandfather   . Hypertension Paternal Grandmother   . Diabetes Paternal Grandmother    Social History  Substance Use Topics  . Smoking status: Never Smoker   . Smokeless tobacco: Never Used  . Alcohol Use: No   OB History    Gravida Para Term Preterm AB TAB SAB Ectopic Multiple Living   1 1 1       0 1     Review of Systems  Constitutional: Negative for fever and chills.  HENT: Positive for facial swelling and trouble swallowing. Negative for congestion and voice change.   Eyes: Negative for redness.  Respiratory:  Negative for shortness of breath and wheezing.   Cardiovascular: Negative for chest pain.  Gastrointestinal: Negative for nausea, vomiting and abdominal pain.  Genitourinary: Negative for dysuria.  Musculoskeletal: Negative for myalgias and back pain.  Skin: Positive for rash.  Neurological: Negative for headaches.  Hematological: Does not bruise/bleed easily.  Psychiatric/Behavioral: Negative for confusion.      Allergies  Orange fruit and Shellfish allergy  Home Medications   Prior to Admission medications   Medication Sig Start Date End Date Taking? Authorizing Provider  cimetidine (TAGAMET) 400 MG tablet Take 400 mg by mouth daily as needed (for swelling).  10/26/15  Yes Historical Provider, MD  norethindrone (MICRONOR,CAMILA,ERRIN) 0.35 MG tablet Take 1 tablet (0.35 mg total) by mouth daily. 04/15/15  Yes Tilda BurrowJohn V Ferguson, MD  predniSONE (DELTASONE) 10 MG tablet Take 10 mg by mouth daily as needed (for swelling).  10/16/15  Yes Historical Provider, MD  acyclovir (ZOVIRAX) 400 MG tablet Take 1 tablet (400 mg total) by mouth 2 (two) times daily. 10/29/15   Jacklyn ShellFrances Cresenzo-Dishmon, CNM  diphenhydrAMINE (BENADRYL) 25 MG tablet Take 1 tablet (25 mg total) by mouth every 6 (six) hours. 10/31/15   Vanetta MuldersScott Markeita Alicia, MD  predniSONE (DELTASONE) 10 MG tablet Take 4 tablets (40 mg total) by mouth daily. 10/31/15   Vanetta MuldersScott Lalena Salas, MD  Prenatal Vit-Fe Fumarate-FA (PNV PRENATAL PLUS MULTIVITAMIN) 27-1 MG TABS Take 1 tablet by mouth daily. 08/20/14   Jacklyn ShellFrances Cresenzo-Dishmon,  CNM   BP 110/71 mmHg  Pulse 108  Temp(Src) 98.3 F (36.8 C) (Oral)  Resp 15  Ht 5\' 6"  (1.676 m)  Wt 62.596 kg  BMI 22.28 kg/m2  SpO2 100%  LMP 10/31/2015  Breastfeeding? Yes Physical Exam  Constitutional: She is oriented to person, place, and time. She appears well-developed and well-nourished.  HENT:  Head: Normocephalic and atraumatic.  Mouth/Throat: Oropharynx is clear and moist. No oropharyngeal exudate.  No uvula  no tongue swelling mild upper lip swelling no lower lip swelling voice normal.  Eyes: Pupils are equal, round, and reactive to light.  Neck: Normal range of motion. Neck supple.  Cardiovascular: Normal rate and regular rhythm.   No murmur heard. Pulmonary/Chest: Effort normal and breath sounds normal. No respiratory distress. She has no wheezes.  Abdominal: Soft. Bowel sounds are normal. There is no tenderness.  Musculoskeletal: Normal range of motion.  Neurological: She is alert and oriented to person, place, and time. No cranial nerve deficit. She exhibits normal muscle tone. Coordination normal.  Skin: Skin is warm. Rash noted.  Erythematous hives predominantly extremities but some on the face and chest. They do blanch.  Nursing note and vitals reviewed.   ED Course  Procedures (including critical care time) Labs Review Labs Reviewed - No data to display  Imaging Review No results found. I have personally reviewed and evaluated these images and lab results as part of my medical decision-making.   EKG Interpretation None      MDM   Final diagnoses:  Urticaria    Patient one month history of hives followed by primary care doctor dermatology an allergist. Patient just finished a course of prednisone had 10 mg today. Patient is taking Tagamet as a H2 blocker. Patient not on Benadryl. Patient with some upper lip swelling feels as if the throat is tight no change in her voice. Wheezing. Patient improved here with IV Solu Medrol and IV Benadryl. Patient is not breast-feeding. We'll have her continue the Benadryl continue a new course of prednisone 40 mg a day for the next 5 days.  Patient will continue to follow-up with her allergist. Patient given precautions to return for or tongue swelling trouble breathing change in her voice. New or worse symptoms.  With the treatments in the emergency department patient's hives not resolved but have improved and upper lip swelling has  resolved.    Vanetta MuldersScott Kuron Docken, MD 10/31/15 2049

## 2015-10-31 NOTE — ED Notes (Signed)
The pt has had hives for one month she just finished a dose pack of prednisone  She took one just before she arrived.  Her hives are worse today  She feels like her throat is closing.  Speech is normal.  No vosible distress.  She is seeing an allergist this coiming wed  lmp now

## 2015-10-31 NOTE — ED Notes (Signed)
No swelling in the back of her throat

## 2016-05-09 NOTE — L&D Delivery Note (Signed)
Delivery Note At 0925 a viable female was delivered via  (Presentation:Straight OP ;  ).  APGAR: , ; weight  .  pending Placenta status: intact, trailing membranes removed with spongestick.  Cord: 3 vessels with the following complications: none   Anesthesia:  none Episiotomy:  none Lacerations:  skid mark Suture Repair: none Est. Blood Loss (mL):  100  Mom to postpartum.  Baby to Couplet care / Skin to Skin.  Lori A Clemmons CNM 12/31/2016, 9:40 AM

## 2016-05-24 LAB — OB RESULTS CONSOLE ANTIBODY SCREEN: Antibody Screen: NEGATIVE

## 2016-05-24 LAB — OB RESULTS CONSOLE ABO/RH: RH Type: POSITIVE

## 2016-05-24 LAB — OB RESULTS CONSOLE RPR: RPR: NONREACTIVE

## 2016-05-24 LAB — OB RESULTS CONSOLE HEPATITIS B SURFACE ANTIGEN: Hepatitis B Surface Ag: NEGATIVE

## 2016-05-24 LAB — OB RESULTS CONSOLE RUBELLA ANTIBODY, IGM: RUBELLA: IMMUNE

## 2016-05-24 LAB — OB RESULTS CONSOLE HIV ANTIBODY (ROUTINE TESTING): HIV: NONREACTIVE

## 2016-06-01 ENCOUNTER — Other Ambulatory Visit (HOSPITAL_COMMUNITY)
Admission: RE | Admit: 2016-06-01 | Discharge: 2016-06-01 | Disposition: A | Discharge status: Home / Self Care | Payer: 59 | Source: Ambulatory Visit | Attending: Obstetrics and Gynecology | Admitting: Obstetrics and Gynecology

## 2016-06-01 ENCOUNTER — Other Ambulatory Visit: Payer: Self-pay | Admitting: Obstetrics and Gynecology

## 2016-06-01 DIAGNOSIS — Z01411 Encounter for gynecological examination (general) (routine) with abnormal findings: Secondary | ICD-10-CM | POA: Insufficient documentation

## 2016-06-03 LAB — CYTOLOGY - PAP
Adequacy: ABSENT
DIAGNOSIS: NEGATIVE

## 2016-12-03 LAB — OB RESULTS CONSOLE GBS: GBS: NEGATIVE

## 2016-12-23 ENCOUNTER — Encounter (HOSPITAL_COMMUNITY): Payer: Self-pay | Admitting: *Deleted

## 2016-12-23 ENCOUNTER — Telehealth (HOSPITAL_COMMUNITY): Payer: Self-pay | Admitting: *Deleted

## 2016-12-23 NOTE — Telephone Encounter (Signed)
Preadmission screen  

## 2016-12-30 ENCOUNTER — Inpatient Hospital Stay (HOSPITAL_COMMUNITY)
Admission: AD | Admit: 2016-12-30 | Discharge: 2017-01-02 | DRG: 774 | Disposition: A | Discharge status: Home / Self Care | Payer: 59 | Source: Ambulatory Visit | Attending: Obstetrics & Gynecology | Admitting: Obstetrics & Gynecology

## 2016-12-30 ENCOUNTER — Encounter (HOSPITAL_COMMUNITY): Payer: Self-pay

## 2016-12-30 DIAGNOSIS — Z3A40 40 weeks gestation of pregnancy: Secondary | ICD-10-CM | POA: Diagnosis not present

## 2016-12-30 DIAGNOSIS — Z3493 Encounter for supervision of normal pregnancy, unspecified, third trimester: Secondary | ICD-10-CM | POA: Diagnosis present

## 2016-12-30 DIAGNOSIS — O9832 Other infections with a predominantly sexual mode of transmission complicating childbirth: Principal | ICD-10-CM | POA: Diagnosis present

## 2016-12-30 DIAGNOSIS — A6000 Herpesviral infection of urogenital system, unspecified: Secondary | ICD-10-CM | POA: Diagnosis present

## 2016-12-30 HISTORY — DX: Anxiety disorder, unspecified: F41.9

## 2016-12-30 HISTORY — DX: Anemia, unspecified: D64.9

## 2016-12-30 HISTORY — DX: Unspecified infectious disease: B99.9

## 2016-12-30 LAB — CBC
HEMATOCRIT: 35.6 % — AB (ref 36.0–46.0)
HEMOGLOBIN: 12 g/dL (ref 12.0–15.0)
MCH: 26.7 pg (ref 26.0–34.0)
MCHC: 33.7 g/dL (ref 30.0–36.0)
MCV: 79.1 fL (ref 78.0–100.0)
Platelets: 236 10*3/uL (ref 150–400)
RBC: 4.5 MIL/uL (ref 3.87–5.11)
RDW: 16.3 % — ABNORMAL HIGH (ref 11.5–15.5)
WBC: 11.9 10*3/uL — ABNORMAL HIGH (ref 4.0–10.5)

## 2016-12-30 LAB — TYPE AND SCREEN
ABO/RH(D): O POS
ANTIBODY SCREEN: NEGATIVE

## 2016-12-30 MED ORDER — FENTANYL CITRATE (PF) 100 MCG/2ML IJ SOLN
50.0000 ug | INTRAMUSCULAR | Status: DC | PRN
  Administered 2016-12-31 (×3): 100 ug via INTRAVENOUS
  Filled 2016-12-30 (×4): qty 2

## 2016-12-30 MED ORDER — ACETAMINOPHEN 325 MG PO TABS: 650.0000 mg | ORAL_TABLET | ORAL | Status: DC | PRN

## 2016-12-30 MED ORDER — LACTATED RINGERS IV SOLN: 500.0000 mL | INTRAVENOUS | Status: DC | PRN

## 2016-12-30 MED ORDER — OXYTOCIN BOLUS FROM INFUSION
500.0000 mL | Freq: Once | INTRAVENOUS | Status: AC
  Administered 2016-12-31: 500 mL via INTRAVENOUS

## 2016-12-30 MED ORDER — LACTATED RINGERS IV SOLN: INTRAVENOUS | Status: DC

## 2016-12-30 MED ORDER — SOD CITRATE-CITRIC ACID 500-334 MG/5ML PO SOLN: 30.0000 mL | ORAL | Status: DC | PRN

## 2016-12-30 MED ORDER — LIDOCAINE HCL (PF) 1 % IJ SOLN
30.0000 mL | INTRAMUSCULAR | Status: AC | PRN
  Administered 2016-12-31: 30 mL via SUBCUTANEOUS
  Filled 2016-12-30: qty 30

## 2016-12-30 MED ORDER — ONDANSETRON HCL 4 MG/2ML IJ SOLN
4.0000 mg | Freq: Four times a day (QID) | INTRAMUSCULAR | Status: DC | PRN
Start: 2016-12-30 — End: 2016-12-31

## 2016-12-30 MED ORDER — OXYTOCIN 40 UNITS IN LACTATED RINGERS INFUSION - SIMPLE MED
2.5000 [IU]/h | INTRAVENOUS | Status: DC
  Filled 2016-12-30: qty 1000

## 2016-12-30 NOTE — MAU Note (Signed)
Pt was having ctx earlier that were 5 min apart, now have spread out. No LOF or bleeding.

## 2016-12-30 NOTE — MAU Note (Signed)
Pt evaluated over the phone with Illene Bolus CNM. PT will walk for 1 hour and recheck.

## 2016-12-30 NOTE — MAU Note (Signed)
Could not find records for GBS status, pt thinks she is negative. Not planning epidural.

## 2016-12-30 NOTE — H&P (Signed)
Gail Johnson is a 26 y.o. female presenting for contractions.  Reports contractions started at 1600 and have been fairly regular, although they have slowed down since admission. Patient is under the care of [facility] and is attended by Dr. Carroll Sage. 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  Patient reports taking valtrex daily and last dose was last night.  Patient is GBS negative and does not desire an epidural.  OB History    Gravida Para Term Preterm AB Living   2 1 1     1    SAB TAB Ectopic Multiple Live Births         0 1    12/2014: Female 8lbs 15oz   Past Medical History:  Diagnosis Date  . BV (bacterial vaginosis) 07/04/2014  . Chlamydia   . HSV-2 infection   . Multiple allergies    gts hives  . Vaginal discharge 07/04/2014   Past Surgical History:  Procedure Laterality Date  . NO PAST SURGERIES     Family History: family history includes Breast cancer in her paternal grandmother; Diabetes in her paternal grandmother; Heart disease in her maternal grandfather; Hypertension in her maternal grandmother and paternal grandmother. Social History:  reports that she has never smoked. She has never used smokeless tobacco. She reports that she does not drink alcohol or use drugs.     Maternal Diabetes: No Genetic Screening: Normal Maternal Ultrasounds/Referrals: Normal Fetal Ultrasounds or other Referrals:  None Maternal Substance Abuse:  No Significant Maternal Medications:  Meds include: Other:  Significant Maternal Lab Results:  Lab values include: Group B Strep negative Other Comments:  None  Review of Systems  Constitutional: Negative.   Cardiovascular: Negative.   Gastrointestinal: Negative for constipation, diarrhea, nausea and vomiting.  Genitourinary: Negative.   Musculoskeletal: Negative.    Maternal Medical History:  Reason for admission: Contractions.  Nausea.  Contractions: Onset was 3-5 hours ago.    Perceived severity is mild.    Fetal activity: Perceived fetal activity is normal.   Last perceived fetal movement was within the past hour.    Prenatal complications: no prenatal complications Prenatal Complications - Diabetes: none.    Dilation: 4 Effacement (%): 80 Station: -3 Exam by:: Ginnie Smart RN Blood pressure 121/75, pulse 91, temperature 98.6 F (37 C), resp. rate 18, last menstrual period 03/21/2016, currently breastfeeding. Maternal Exam:  Uterine Assessment: Contraction strength is mild.  Contraction frequency is regular.   Abdomen: Patient reports no abdominal tenderness. Fundal height is AGA.   Fetal presentation: vertex  Pelvis: adequate for delivery.   Cervix: Cervix evaluated by digital exam.   By Nurse  Fetal Exam Fetal Monitor Review: Baseline rate: 135.  Variability: moderate (6-25 bpm).   Pattern: accelerations present and no decelerations.    Fetal State Assessment: Category I - tracings are normal.     Physical Exam  Constitutional: She is oriented to person, place, and time. She appears well-developed and well-nourished. No distress.  HENT:  Head: Normocephalic and atraumatic.  Eyes: Conjunctivae are normal.  Neck: Normal range of motion.  Cardiovascular: Normal rate, regular rhythm and normal heart sounds.   Respiratory: Effort normal and breath sounds normal.  GI: Soft. Bowel sounds are normal.  Gravid--fundal height appears AGA, Soft RT, NT  Musculoskeletal: Normal range of motion. She exhibits no edema.  Neurological: She is alert and oriented to person, place, and time.  Skin: Skin is warm and dry.  Psychiatric: She has a  normal mood and affect. Her behavior is normal.    Prenatal labs: ABO, Rh: O/Positive/-- (01/16 0000) Antibody: Negative (01/16 0000) Rubella: Immune (01/16 0000) RPR: Nonreactive (01/16 0000)  HBsAg: Negative (01/16 0000)  HIV: Non-reactive (01/16 0000)  GBS:  Negative   Assessment/Plan: IUP at  40.4wks Cat I FT Active Labor GBS Negative HSV History  Admit to [employer]  Routine Labor and Delivery Orders per [facility] Protocol In room to complete assessment and discuss POC: Patient desires no epidural-Informed that she may requests, if desired, at anytime Okay for intermittent monitoring to allow for ambulation Will consider pitocin augmentation if appropriate Continue other mgmt as ordered Dr.AR updated on patient admission and status   Cherre Robins MSN, CNM 12/30/2016, 7:57 PM

## 2016-12-31 ENCOUNTER — Encounter (HOSPITAL_COMMUNITY): Payer: Self-pay | Admitting: *Deleted

## 2016-12-31 ENCOUNTER — Inpatient Hospital Stay (HOSPITAL_COMMUNITY): Payer: 59 | Admitting: Anesthesiology

## 2016-12-31 LAB — RPR: RPR: NONREACTIVE

## 2016-12-31 MED ORDER — FENTANYL 2.5 MCG/ML BUPIVACAINE 1/10 % EPIDURAL INFUSION (WH - ANES)
INTRAMUSCULAR | Status: AC
  Filled 2016-12-31: qty 100

## 2016-12-31 MED ORDER — SIMETHICONE 80 MG PO CHEW: 80.0000 mg | CHEWABLE_TABLET | ORAL | Status: DC | PRN

## 2016-12-31 MED ORDER — PRENATAL MULTIVITAMIN CH
1.0000 | ORAL_TABLET | Freq: Every day | ORAL | Status: DC
  Administered 2016-12-31 – 2017-01-01 (×2): 1 via ORAL
  Filled 2016-12-31 (×2): qty 1

## 2016-12-31 MED ORDER — LACTATED RINGERS IV SOLN: 500.0000 mL | Freq: Once | INTRAVENOUS | Status: DC

## 2016-12-31 MED ORDER — DIBUCAINE 1 % RE OINT: 1.0000 "application " | TOPICAL_OINTMENT | RECTAL | Status: DC | PRN

## 2016-12-31 MED ORDER — PHENYLEPHRINE 40 MCG/ML (10ML) SYRINGE FOR IV PUSH (FOR BLOOD PRESSURE SUPPORT): 80.0000 ug | PREFILLED_SYRINGE | INTRAVENOUS | Status: DC | PRN

## 2016-12-31 MED ORDER — ACETAMINOPHEN 325 MG PO TABS
650.0000 mg | ORAL_TABLET | ORAL | Status: DC | PRN
  Administered 2017-01-01: 650 mg via ORAL
  Filled 2016-12-31: qty 2

## 2016-12-31 MED ORDER — PHENYLEPHRINE 40 MCG/ML (10ML) SYRINGE FOR IV PUSH (FOR BLOOD PRESSURE SUPPORT)
PREFILLED_SYRINGE | INTRAVENOUS | Status: AC
  Filled 2016-12-31: qty 10

## 2016-12-31 MED ORDER — EPHEDRINE 5 MG/ML INJ: 10.0000 mg | INTRAVENOUS | Status: DC | PRN

## 2016-12-31 MED ORDER — ONDANSETRON HCL 4 MG PO TABS: 4.0000 mg | ORAL_TABLET | ORAL | Status: DC | PRN

## 2016-12-31 MED ORDER — FENTANYL 2.5 MCG/ML BUPIVACAINE 1/10 % EPIDURAL INFUSION (WH - ANES)
14.0000 mL/h | INTRAMUSCULAR | Status: DC | PRN
  Administered 2016-12-31: 14 mL/h via EPIDURAL

## 2016-12-31 MED ORDER — WITCH HAZEL-GLYCERIN EX PADS: 1.0000 "application " | MEDICATED_PAD | CUTANEOUS | Status: DC | PRN

## 2016-12-31 MED ORDER — COCONUT OIL OIL: 1.0000 "application " | TOPICAL_OIL | Status: DC | PRN

## 2016-12-31 MED ORDER — DIPHENHYDRAMINE HCL 25 MG PO CAPS: 25.0000 mg | ORAL_CAPSULE | Freq: Four times a day (QID) | ORAL | Status: DC | PRN

## 2016-12-31 MED ORDER — ONDANSETRON HCL 4 MG/2ML IJ SOLN: 4.0000 mg | INTRAMUSCULAR | Status: DC | PRN

## 2016-12-31 MED ORDER — IBUPROFEN 600 MG PO TABS
600.0000 mg | ORAL_TABLET | Freq: Four times a day (QID) | ORAL | Status: DC
  Administered 2016-12-31 – 2017-01-02 (×8): 600 mg via ORAL
  Filled 2016-12-31 (×8): qty 1

## 2016-12-31 MED ORDER — BENZOCAINE-MENTHOL 20-0.5 % EX AERO
1.0000 "application " | INHALATION_SPRAY | CUTANEOUS | Status: DC | PRN
  Administered 2016-12-31: 1 via TOPICAL
  Filled 2016-12-31: qty 56

## 2016-12-31 MED ORDER — LIDOCAINE HCL (PF) 1 % IJ SOLN
INTRAMUSCULAR | Status: DC | PRN
  Administered 2016-12-31: 5 mL via EPIDURAL
  Administered 2016-12-31: 2 mL via EPIDURAL
  Administered 2016-12-31: 3 mL via EPIDURAL

## 2016-12-31 MED ORDER — SENNOSIDES-DOCUSATE SODIUM 8.6-50 MG PO TABS
2.0000 | ORAL_TABLET | ORAL | Status: DC
  Administered 2017-01-01: 2 via ORAL
  Filled 2016-12-31: qty 2

## 2016-12-31 MED ORDER — ZOLPIDEM TARTRATE 5 MG PO TABS: 5.0000 mg | ORAL_TABLET | Freq: Every evening | ORAL | Status: DC | PRN

## 2016-12-31 MED ORDER — TETANUS-DIPHTH-ACELL PERTUSSIS 5-2.5-18.5 LF-MCG/0.5 IM SUSP: 0.5000 mL | Freq: Once | INTRAMUSCULAR | Status: DC

## 2016-12-31 MED ORDER — DIPHENHYDRAMINE HCL 50 MG/ML IJ SOLN: 12.5000 mg | INTRAMUSCULAR | Status: DC | PRN

## 2016-12-31 NOTE — Progress Notes (Signed)
Gail Johnson [id number]  Subjective: Nurse reports patient in transitional stage of labor.  Strip and Chart Reviewed.   Objective:  Vitals:   12/31/16 0107 12/31/16 0229 12/31/16 0302 12/31/16 0349  BP: 125/81 134/89 135/84 138/87  Pulse: 88 91 84 86  Resp: 18     Temp:      TempSrc:      SpO2:      Weight:      Height:        FHR: 135 bpm, Mod Var, -Decels, +Accels UC: Q1-71min  Dilation: 7 Effacement (%): 80 Cervical Position: Middle Station: 0 Presentation: Vertex Exam by:: Rolene Arbour, RN   Assessment: IUP at [redacted]w[redacted]d Cat I FT Transitional Phase Expectant Mgmt  Plan: -Continue expectant mgmt -Pain medication as desired by patient -Continue other mgmt as ordered  Sabas Sous, CNM 12/31/2016 3:54 AM

## 2016-12-31 NOTE — Anesthesia Procedure Notes (Signed)
Epidural Patient location during procedure: OB Start time: 12/31/2016 7:30 AM End time: 12/31/2016 7:35 AM  Staffing Anesthesiologist: Cecile Hearing Performed: anesthesiologist   Preanesthetic Checklist Completed: patient identified, pre-op evaluation, timeout performed, IV checked, risks and benefits discussed and monitors and equipment checked  Epidural Patient position: sitting Prep: DuraPrep Patient monitoring: blood pressure and continuous pulse ox Approach: midline Location: L3-L4 Injection technique: LOR air  Needle:  Needle type: Tuohy  Needle gauge: 17 G Needle length: 9 cm Needle insertion depth: 5 cm Catheter size: 19 Gauge Catheter at skin depth: 10 cm Test dose: negative and Other (1% Lidocaine)  Additional Notes Patient identified.  Risk benefits discussed including failed block, incomplete pain control, headache, nerve damage, paralysis, blood pressure changes, nausea, vomiting, reactions to medication both toxic or allergic, and postpartum back pain.  Patient expressed understanding and wished to proceed.  All questions were answered.  Sterile technique used throughout procedure and epidural site dressed with sterile barrier dressing. No paresthesia or other complications noted. The patient did not experience any signs of intravascular injection such as tinnitus or metallic taste in mouth nor signs of intrathecal spread such as rapid motor block. Please see nursing notes for vital signs. Reason for block:procedure for pain

## 2016-12-31 NOTE — Anesthesia Postprocedure Evaluation (Signed)
Anesthesia Post Note  Patient: Gail Johnson  Procedure(s) Performed: * No procedures listed *     Patient location during evaluation: Mother Baby Anesthesia Type: Epidural Level of consciousness: awake Pain management: pain level controlled Vital Signs Assessment: post-procedure vital signs reviewed and stable Respiratory status: spontaneous breathing Cardiovascular status: stable Postop Assessment: no headache, no backache, epidural receding, patient able to bend at knees, no signs of nausea or vomiting and adequate PO intake Anesthetic complications: no    Last Vitals:  Vitals:   12/31/16 1100 12/31/16 1224  BP: 109/81 100/63  Pulse: 90 77  Resp: 16 16  Temp: 37.4 C 37.4 C  SpO2: 99% 99%    Last Pain:  Vitals:   12/31/16 1224  TempSrc: Oral  PainSc: 0-No pain   Pain Goal: Patients Stated Pain Goal: 4 (12/31/16 0216)               [name]

## 2016-12-31 NOTE — Progress Notes (Signed)
Gail Johnson MRN: [id number]  Subjective: -Patient coping with contractions with staff and family support.  Reports some mild urge to push.  Objective: BP 123/87   Pulse 90   Temp 98.3 F (36.8 C) (Oral)   Resp 18   Ht 5\' 6"  (1.676 m)   Wt 73.5 kg (162 lb)   LMP 03/21/2016   SpO2 99%   BMI 26.15 kg/m  No intake/output data recorded. No intake/output data recorded.  Fetal Monitoring: FHT: 115 bpm, Mod Var, -Decels, +Accels UC: Q2-36min, palpates moderate to strong    Vaginal Exam: SVE:   Dilation: 8.5 Effacement (%): 100 Station: 0, +1 Exam by:: Gerrit Heck, CNM Membranes:Intact Internal Monitors: None  Augmentation/Induction: Pitocin:None Cytotec: None  Assessment:  IUP at 40.5wks Cat I FT  Expectant Mgmt Transitional Phase  Plan: -Discussed AROM r/b including increased risk of infection, cord prolapse, fetal intolerance, and decreased labor time. No questions or concerns and patient declines -Patient encouraged to take different positions to relieve pain.  -Update given to A. Su Hilt, MD -Report given to L. Clemmons, CNM -Continue other mgmt as ordered  Valma Cava, CNM 12/31/2016, 7:05 AM

## 2016-12-31 NOTE — Anesthesia Preprocedure Evaluation (Signed)
Anesthesia Evaluation  Patient identified by MRN, date of birth, ID band Patient awake    Reviewed: Allergy & Precautions, NPO status , Patient's Chart, lab work & pertinent test results  Airway Mallampati: II  TM Distance: >3 FB Neck ROM: Full    Dental  (+) Teeth Intact, Dental Advisory Given   Pulmonary neg pulmonary ROS,    Pulmonary exam normal breath sounds clear to auscultation       Cardiovascular negative cardio ROS Normal cardiovascular exam Rhythm:Regular Rate:Normal     Neuro/Psych PSYCHIATRIC DISORDERS Anxiety negative neurological ROS     GI/Hepatic negative GI ROS, Neg liver ROS,   Endo/Other  negative endocrine ROS  Renal/GU negative Renal ROS     Musculoskeletal negative musculoskeletal ROS (+)   Abdominal   Peds  Hematology negative hematology ROS (+) Plt 236k   Anesthesia Other Findings Day of surgery medications reviewed with the patient.  Reproductive/Obstetrics (+) Pregnancy                             Anesthesia Physical Anesthesia Plan  ASA: II  Anesthesia Plan: Epidural   Post-op Pain Management:    Induction:   PONV Risk Score and Plan: Treatment may vary due to age or medical condition  Airway Management Planned:   Additional Equipment:   Intra-op Plan:   Post-operative Plan:   Informed Consent: I have reviewed the patients History and Physical, chart, labs and discussed the procedure including the risks, benefits and alternatives for the proposed anesthesia with the patient or authorized representative who has indicated his/her understanding and acceptance.   Dental advisory given  Plan Discussed with:   Anesthesia Plan Comments: (Patient identified. Risks/Benefits/Options discussed with patient including but not limited to bleeding, infection, nerve damage, paralysis, failed block, incomplete pain control, headache, blood pressure changes,  nausea, vomiting, reactions to medication both or allergic, itching and postpartum back pain. Confirmed with bedside nurse the patient's most recent platelet count. Confirmed with patient that they are not currently taking any anticoagulation, have any bleeding history or any family history of bleeding disorders. Patient expressed understanding and wished to proceed. All questions were answered. )        Anesthesia Quick Evaluation

## 2016-12-31 NOTE — Anesthesia Pain Management Evaluation Note (Signed)
  CRNA Pain Management Visit Note  Patient: Gail Johnson, 26 y.o., female  "Hello I am a member of the anesthesia team at [facility]. We have an anesthesia team available at all times to provide care throughout the hospital, including epidural management and anesthesia for C-section. I don't know your plan for the delivery whether it a natural birth, water birth, IV sedation, nitrous supplementation, doula or epidural, but we want to meet your pain goals."   1.Was your pain managed to your expectations on prior hospitalizations?   Yes   2.What is your expectation for pain management during this hospitalization?     Epidural  3.How can we help you reach that goal? Support prn  Record the patient's initial score and the patient's pain goal.   Pain: 4  Pain Goal: 4 [facility] wants you to be able to say your pain was always managed very well.  [facility] 12/31/2016

## 2017-01-01 MED ORDER — OXYCODONE-ACETAMINOPHEN 5-325 MG PO TABS: 1.0000 | ORAL_TABLET | Freq: Four times a day (QID) | ORAL | Status: DC | PRN

## 2017-01-01 NOTE — Progress Notes (Signed)
Post Partum Day 1 Subjective: Having cramping with nursing and wants to go home tomorrow  Objective: Blood pressure (!) 105/56, pulse 79, temperature 98.3 F (36.8 C), temperature source Oral, resp. rate 16, height 5\' 6"  (1.676 m), weight 162 lb (73.5 kg), last menstrual period 03/21/2016, SpO2 100 %, unknown if currently breastfeeding.  Physical Exam:  General: alert and cooperative Lochia: appropriate Uterine Fundus: firm Incision:  DVT Evaluation: No evidence of DVT seen on physical exam.   Recent Labs  12/30/16 2018  HGB 12.0  HCT 35.6*    Assessment/Plan: Plan for discharge tomorrow and Breastfeeding   LOS: 2 days   Lawson Fiscal A Clemmons 01/01/2017, 12:53 PM

## 2017-01-01 NOTE — Lactation Note (Signed)
This note was copied from a baby's chart. Lactation Consultation Note  Patient Name: Gail Johnson Age NIDPO'E Date: 01/01/2017 Reason for consult: Initial assessment   P2, Ex BF for 7 months.  Mother easily expressed colostrum before latching. Baby relatched in cradle hold.  Mother flanged bottom lip. Intermittent sucks and swallows observed. Discussed basics.  Mom encouraged to feed baby 8-12 times/24 hours and with feeding cues.  Mom made aware of O/P services, breastfeeding support groups, community resources, and our phone # for post-discharge questions.     Maternal Data Has patient been taught Hand Expression?: Yes Does the patient have breastfeeding experience prior to this delivery?: Yes  Feeding Feeding Type: Breast Fed Length of feed: 30 min  LATCH Score Latch: Grasps breast easily, tongue down, lips flanged, rhythmical sucking.  Audible Swallowing: A few with stimulation  Type of Nipple: Everted at rest and after stimulation  Comfort (Breast/Nipple): Soft / non-tender  Hold (Positioning): No assistance needed to correctly position infant at breast.  LATCH Score: 9  Interventions    Lactation Tools Discussed/Used     Consult Status Consult Status: Follow-up Date: 01/02/17 Follow-up type: In-patient    Dahlia Byes [facility] 01/01/2017, 8:30 AM

## 2017-01-02 MED ORDER — IBUPROFEN 600 MG PO TABS: 600.0000 mg | ORAL_TABLET | Freq: Four times a day (QID) | ORAL | 1 refills | Status: DC | PRN

## 2017-01-02 NOTE — Discharge Summary (Signed)
OB Discharge Summary     Patient Name: Gail Johnson DOB: 18-Oct-1990 MRN: [id number]  Date of admission: 12/30/2016 Delivering MD: Illene Bolus A   Date of discharge: 01/02/2017  Admitting diagnosis: 40WKS CTX Intrauterine pregnancy: [redacted]w[redacted]d     Secondary diagnosis:  Active Problems:   Indication for care or intervention in labor or delivery  Additional problems: None     Discharge diagnosis: Term Pregnancy Delivered                                                                                                Post partum procedures:NA  Augmentation: None  Complications: None  Hospital course:  Onset of Labor With Vaginal Delivery     26 y.o. yo [id number] at [redacted]w[redacted]d was admitted in Active Labor on 12/30/2016. Patient had an uncomplicated labor course as follows:  Membrane Rupture Time/Date: 9:10 AM ,12/31/2016   Intrapartum Procedures: Episiotomy:                                           Lacerations:     Patient had a delivery of a Viable infant. 12/31/2016  Information for the patient's newborn:  Gail, Johnson [[id number]]  Delivery Method: Vaginal, Spontaneous Delivery (Filed from Delivery Summary)    Pateint had an uncomplicated postpartum course.  She is ambulating, tolerating a regular diet, passing flatus, and urinating well. Patient is discharged home in stable condition on 01/02/17.   Physical exam  Vitals:   12/31/16 1613 01/01/17 0615 01/01/17 1704 01/02/17 0648  BP: 106/66 (!) 105/56 118/76 109/65  Pulse: 70 79 85 87  Resp: 16 16 18 18   Temp: 99.4 F (37.4 C) 98.3 F (36.8 C) 98.7 F (37.1 C) 98.7 F (37.1 C)  TempSrc: Oral Oral Oral   SpO2: 99% 100% 100% 100%  Weight:      Height:       General: alert, cooperative and no distress Lochia: appropriate Uterine Fundus: firm Incision: N/A DVT Evaluation: No evidence of DVT seen on physical exam. Labs: Lab Results  Component Value Date   WBC 11.9 (H) 12/30/2016   HGB 12.0 12/30/2016   HCT 35.6  (L) 12/30/2016   MCV 79.1 12/30/2016   PLT 236 12/30/2016   CMP Latest Ref Rng & Units 09/10/2014  Glucose 65 - 99 mg/dL 01(U)  BUN 6 - 23 mg/dL -  Creatinine 9.32 - 3.55 mg/dL -  Sodium 732 - 202 mEq/L -  Potassium 3.7 - 5.3 mEq/L -  Chloride 96 - 112 mEq/L -  CO2 19 - 32 mEq/L -  Calcium 8.4 - 10.5 mg/dL -  Total Protein 6.0 - 8.3 g/dL -  Total Bilirubin 0.3 - 1.2 mg/dL -  Alkaline Phos 39 - 542 U/L -  AST 0 - 37 U/L -  ALT 0 - 35 U/L -    Discharge instruction: per After Visit Summary and "Baby and Me Booklet".  After visit meds:  Allergies as of 01/02/2017   No  Known Allergies     Medication List    STOP taking these medications   acyclovir 400 MG tablet Commonly known as:  ZOVIRAX   norethindrone 0.35 MG tablet Commonly known as:  MICRONOR,CAMILA,ERRIN   valACYclovir 500 MG tablet Commonly known as:  VALTREX     TAKE these medications   ibuprofen 600 MG tablet Commonly known as:  ADVIL,MOTRIN Take 1 tablet (600 mg total) by mouth every 6 (six) hours as needed.   PNV PRENATAL PLUS MULTIVITAMIN 27-1 MG Tabs Take 1 tablet by mouth daily.            Discharge Care Instructions        Start     Ordered   01/02/17 0000  ibuprofen (ADVIL,MOTRIN) 600 MG tablet  Every 6 hours PRN     01/02/17 0753   01/02/17 0000  Call MD for:  temperature >100.4     01/02/17 0753   01/02/17 0000  Call MD for:  persistant nausea and vomiting     01/02/17 0753   01/02/17 0000  Call MD for:  severe uncontrolled pain     01/02/17 0753   01/02/17 0000  Call MD for:  difficulty breathing, headache or visual disturbances     01/02/17 0753   01/02/17 0000  Activity as tolerated     01/02/17 0753   01/02/17 0000  Sexual acrtivity    Comments:  Avoid sex for 6 weeks   01/02/17 0753   01/02/17 0000  Diet general     01/02/17 0753   12/30/16 0000  OB RESULT CONSOLE Group B Strep    Comments:  This external order was created through the Results Console.   12/30/16 2120       Diet: routine diet  Activity: Advance as tolerated. Pelvic rest for 6 weeks.   Outpatient follow up:6 weeks Follow up Appt:No future appointments. Follow up Visit:No Follow-up on file.  Postpartum contraception: Not Discussed  Newborn Data: Live born female  Birth Weight: 8 lb 6.8 oz (3820 g) APGAR: 8, 9  Baby Feeding: Breast Disposition:home with mother   01/02/2017 [name], MD

## 2017-01-02 NOTE — Lactation Note (Signed)
This note was copied from a baby's chart. Lactation Consultation Note  Patient Name: Gail Johnson EFEOF'H Date: 01/02/2017 Reason for consult: Follow-up assessment  Follow up visit at 48 hours of age.  Mom reports good feedings, baby is asleep in moms arms now.  Baby has had 9 feedings with 3 voids and 6 stools.  Last latch score was "10" by RN.  Mom denies concerns at this time and breasts are starting to become more full. Reviewed basics with mom.  WIC representative at bedside when [facility] left the room.  Discussed milk transitioning to larger volume, engorgement care discussed.  Encouraged frequent feedings. Mom to soften breast as needed prior to latch.   Mom aware of o/p services as needed.      Maternal Data Has patient been taught Hand Expression?: Yes  Feeding Feeding Type: Breast Fed Length of feed: 25 min  LATCH Score Latch: Grasps breast easily, tongue down, lips flanged, rhythmical sucking.  Audible Swallowing: Spontaneous and intermittent  Type of Nipple: Everted at rest and after stimulation  Comfort (Breast/Nipple): Soft / non-tender  Hold (Positioning): No assistance needed to correctly position infant at breast.  LATCH Score: 10  Interventions Interventions: Breast feeding basics reviewed  Lactation Tools Discussed/Used WIC Program: Yes (WIC representative at bedside)   Consult Status Consult Status: Complete    Franz Dell 01/02/2017, 9:49 AM

## 2017-01-04 ENCOUNTER — Inpatient Hospital Stay (HOSPITAL_COMMUNITY): Admission: RE | Admit: 2017-01-04 | Payer: 59 | Source: Ambulatory Visit

## 2017-01-06 ENCOUNTER — Telehealth (HOSPITAL_COMMUNITY): Payer: Self-pay | Admitting: Lactation Services

## 2017-01-16 ENCOUNTER — Encounter (HOSPITAL_COMMUNITY): Payer: Self-pay | Admitting: *Deleted

## 2017-01-16 ENCOUNTER — Inpatient Hospital Stay (HOSPITAL_COMMUNITY)
Admission: AD | Admit: 2017-01-16 | Discharge: 2017-01-17 | Disposition: A | Discharge status: Home / Self Care | Payer: 59 | Source: Ambulatory Visit | Attending: Obstetrics and Gynecology | Admitting: Obstetrics and Gynecology

## 2017-01-16 DIAGNOSIS — F419 Anxiety disorder, unspecified: Secondary | ICD-10-CM | POA: Insufficient documentation

## 2017-01-16 DIAGNOSIS — R509 Fever, unspecified: Secondary | ICD-10-CM | POA: Diagnosis present

## 2017-01-16 DIAGNOSIS — N390 Urinary tract infection, site not specified: Secondary | ICD-10-CM | POA: Diagnosis not present

## 2017-01-16 DIAGNOSIS — R319 Hematuria, unspecified: Secondary | ICD-10-CM | POA: Insufficient documentation

## 2017-01-16 DIAGNOSIS — B9689 Other specified bacterial agents as the cause of diseases classified elsewhere: Secondary | ICD-10-CM | POA: Diagnosis not present

## 2017-01-16 DIAGNOSIS — N760 Acute vaginitis: Secondary | ICD-10-CM | POA: Diagnosis not present

## 2017-01-16 DIAGNOSIS — Z79899 Other long term (current) drug therapy: Secondary | ICD-10-CM | POA: Diagnosis not present

## 2017-01-16 LAB — URINALYSIS, ROUTINE W REFLEX MICROSCOPIC
BILIRUBIN URINE: NEGATIVE
GLUCOSE, UA: NEGATIVE mg/dL
KETONES UR: 20 mg/dL — AB
Nitrite: NEGATIVE
PROTEIN: NEGATIVE mg/dL
Specific Gravity, Urine: 1.011 (ref 1.005–1.030)
pH: 6 (ref 5.0–8.0)

## 2017-01-16 LAB — CBC WITH DIFFERENTIAL/PLATELET
BASOS PCT: 0 %
Basophils Absolute: 0 10*3/uL (ref 0.0–0.1)
EOS ABS: 0 10*3/uL (ref 0.0–0.7)
EOS PCT: 0 %
HEMATOCRIT: 38.2 % (ref 36.0–46.0)
Hemoglobin: 12.7 g/dL (ref 12.0–15.0)
Lymphocytes Relative: 18 %
Lymphs Abs: 1.7 10*3/uL (ref 0.7–4.0)
MCH: 26.4 pg (ref 26.0–34.0)
MCHC: 33.2 g/dL (ref 30.0–36.0)
MCV: 79.4 fL (ref 78.0–100.0)
MONO ABS: 0.2 10*3/uL (ref 0.1–1.0)
MONOS PCT: 2 %
Neutro Abs: 7.4 10*3/uL (ref 1.7–7.7)
Neutrophils Relative %: 80 %
PLATELETS: 309 10*3/uL (ref 150–400)
RBC: 4.81 MIL/uL (ref 3.87–5.11)
RDW: 15.7 % — AB (ref 11.5–15.5)
WBC: 9.3 10*3/uL (ref 4.0–10.5)

## 2017-01-16 LAB — WET PREP, GENITAL
Sperm: NONE SEEN
TRICH WET PREP: NONE SEEN
YEAST WET PREP: NONE SEEN

## 2017-01-16 MED ORDER — ACETAMINOPHEN 500 MG PO TABS
1000.0000 mg | ORAL_TABLET | Freq: Once | ORAL | Status: AC
  Administered 2017-01-16: 1000 mg via ORAL
  Filled 2017-01-16: qty 2

## 2017-01-16 NOTE — MAU Note (Signed)
Pt presents to MAU c/o a fever that started today. Pt also is reporting itching and discomfort in her vaginal area, pt states that DR.Gunnar BullaVernado did a wet prep on Friday and has not told her of the results. Pt stats she thought she had BV or a UTI.

## 2017-01-17 DIAGNOSIS — B9689 Other specified bacterial agents as the cause of diseases classified elsewhere: Secondary | ICD-10-CM | POA: Diagnosis not present

## 2017-01-17 DIAGNOSIS — N760 Acute vaginitis: Secondary | ICD-10-CM | POA: Diagnosis not present

## 2017-01-17 DIAGNOSIS — N390 Urinary tract infection, site not specified: Secondary | ICD-10-CM | POA: Diagnosis present

## 2017-01-17 MED ORDER — CEPHALEXIN 500 MG PO CAPS: 500.0000 mg | ORAL_CAPSULE | Freq: Four times a day (QID) | ORAL | 0 refills | Status: AC

## 2017-01-17 MED ORDER — METRONIDAZOLE 500 MG PO TABS: 500.0000 mg | ORAL_TABLET | Freq: Two times a day (BID) | ORAL | 0 refills | Status: AC

## 2017-01-17 NOTE — MAU Provider Note (Signed)
History     CSN: [id number]  Arrival date and time: 01/16/17 2111   First Provider Initiated Contact with Patient 01/16/17 2317      Chief Complaint  Patient presents with  . Fever    PP 2wk    HPI  Ms. Gail Johnson is a 26 y.o. [facility] to [facility] with complaints of fever that started today and vaginal itching and discomfort.  She states that she had a wet prep done by Dr. Dion BodyVarnado on Friday 9/6, but she was not told the results.  She states she "thinks it is BV or a UTI". She denies any mastitis sx's.  She and her spouse report that their 2 yo daughter came home from daycare with a runny nose with green drainage.  They both are concerned she may have contracted an illness from the 2 yo daughter.  The patient reports having a runny nose, but denies any drainage from her nose.  She denies any cough or sore throat.  Past Medical History:  Diagnosis Date  . Anemia   . Anxiety   . BV (bacterial vaginosis) 07/04/2014  . Chlamydia   . HSV-2 infection   . Infection   . Multiple allergies    gts hives  . Vaginal discharge 07/04/2014    Past Surgical History:  Procedure Laterality Date  . NO PAST SURGERIES      Family History  Problem Relation Age of Onset  . Hypertension Maternal Grandmother   . Heart disease Maternal Grandfather   . Hypertension Paternal Grandmother   . Diabetes Paternal Grandmother   . Breast cancer Paternal Grandmother     Social History  Substance Use Topics  . Smoking status: Never Smoker  . Smokeless tobacco: Never Used  . Alcohol use Yes     Comment: mixed drink on 01/08/17    Allergies: No Known Allergies  Prescriptions Prior to Admission  Medication Sig Dispense Refill Last Dose  . ibuprofen (ADVIL,MOTRIN) 600 MG tablet Take 1 tablet (600 mg total) by mouth every 6 (six) hours as needed. 30 tablet 1 Past Week at Unknown time  . Prenatal Vit-Fe Fumarate-FA (PNV PRENATAL PLUS MULTIVITAMIN) 27-1 MG TABS Take 1 tablet by mouth  daily. 30 tablet 11 01/15/2017 at Unknown time    Review of Systems  Constitutional: Positive for fever.  HENT: Positive for rhinorrhea. Negative for sore throat.   Eyes: Negative.   Respiratory: Negative for cough.   Cardiovascular: Negative.   Gastrointestinal: Negative.   Endocrine: Negative.   Genitourinary: Positive for vaginal discharge (with odor).  Musculoskeletal: Negative.   Skin: Negative.   Allergic/Immunologic: Negative.   Neurological: Negative.   Hematological: Negative.   Psychiatric/Behavioral: Negative.    Physical Exam   Blood pressure 115/71, pulse (!) 113, temperature 100.2 F (37.9 C), temperature source Oral, resp. rate 18, SpO2 98 %, currently breastfeeding.  Patient Vitals for the past 24 hrs:  BP Temp Temp src Pulse Resp SpO2  01/17/17 0013 - 100.2 F (37.9 C) Oral (!) 113 - -  01/16/17 2311 - (!) 101.1 F (38.4 C) Oral - - -  01/16/17 2157 115/71 (!) 101.3 F (38.5 C) Oral (!) 124 18 98 %    Physical Exam  Nursing note and vitals reviewed. Constitutional: She is oriented to person, place, and time. She appears well-developed and well-nourished.  HENT:  Head: Normocephalic.  Eyes: Pupils are equal, round, and reactive to light.  Neck: Normal range of motion.  Cardiovascular: Normal rate, regular rhythm and normal heart sounds.   Respiratory: Effort normal and breath sounds normal.  GI: Soft. Bowel sounds are normal.  Genitourinary:  Genitourinary Comments: Uterus: non-tender, cx; smooth, pink, no lesions, moderate amt of thin, white, malodorous d/c, closed/long/firm, no CMT or friability, no adnexal tenderness  Musculoskeletal: Normal range of motion.  Neurological: She is alert and oriented to person, place, and time.  Skin: Skin is warm and dry.  Psychiatric: She has a normal mood and affect. Her behavior is normal. Judgment and thought content normal.    MAU Course  Procedures  MDM CCU UCx CBC w/Diff Wet Prep  *Consult with Dr.  Richardson Dopp @ 0100 - notified of patient's complaints, assessments, lab & U/S results, tx plan Rx Keflex 500 mg po QID x 7 days, Flagyl 500 mg po BID x 7 days - ok to d/c home, agrees with plan  Results for orders placed or performed during the hospital encounter of 01/16/17 (from the past 24 hour(s))  Urinalysis, Routine w reflex microscopic     Status: Abnormal   Collection Time: 01/16/17  9:55 PM  Result Value Ref Range   Color, Urine YELLOW YELLOW   APPearance CLEAR CLEAR   Specific Gravity, Urine 1.011 1.005 - 1.030   pH 6.0 5.0 - 8.0   Glucose, UA NEGATIVE NEGATIVE mg/dL   Hgb urine dipstick SMALL (A) NEGATIVE   Bilirubin Urine NEGATIVE NEGATIVE   Ketones, ur 20 (A) NEGATIVE mg/dL   Protein, ur NEGATIVE NEGATIVE mg/dL   Nitrite NEGATIVE NEGATIVE   Leukocytes, UA LARGE (A) NEGATIVE   RBC / HPF 0-5 0 - 5 RBC/hpf   WBC, UA 6-30 0 - 5 WBC/hpf   Bacteria, UA FEW (A) NONE SEEN   Squamous Epithelial / LPF 0-5 (A) NONE SEEN   Mucus PRESENT   CBC with Differential/Platelet     Status: Abnormal   Collection Time: 01/16/17 10:31 PM  Result Value Ref Range   WBC 9.3 4.0 - 10.5 K/uL   RBC 4.81 3.87 - 5.11 MIL/uL   Hemoglobin 12.7 12.0 - 15.0 g/dL   HCT 95.6 21.3 - 08.6 %   MCV 79.4 78.0 - 100.0 fL   MCH 26.4 26.0 - 34.0 pg   MCHC 33.2 30.0 - 36.0 g/dL   RDW 57.8 (H) 46.9 - 62.9 %   Platelets 309 150 - 400 K/uL   Neutrophils Relative % 80 %   Neutro Abs 7.4 1.7 - 7.7 K/uL   Lymphocytes Relative 18 %   Lymphs Abs 1.7 0.7 - 4.0 K/uL   Monocytes Relative 2 %   Monocytes Absolute 0.2 0.1 - 1.0 K/uL   Eosinophils Relative 0 %   Eosinophils Absolute 0.0 0.0 - 0.7 K/uL   Basophils Relative 0 %   Basophils Absolute 0.0 0.0 - 0.1 K/uL  Wet prep, genital     Status: Abnormal   Collection Time: 01/16/17 11:20 PM  Result Value Ref Range   Yeast Wet Prep HPF POC NONE SEEN NONE SEEN   Trich, Wet Prep NONE SEEN NONE SEEN   Clue Cells Wet Prep HPF POC PRESENT (A) NONE SEEN   WBC, Wet Prep HPF POC  TOO NUMEROUS TO COUNT (A) NONE SEEN   Sperm NONE SEEN     Assessment and Plan  Bacterial vaginitis - Rx Flagyl 500 mg po BID x 7 days - Information on BV given  Urinary tract infection with hematuria, site unspecified - Keflex 500 po QID x 7  days - Information on UTI given - F/U with Dr. Dion Body , if not any better after taking medications  Discharge home Patient verbalized an understanding of the plan of care and agrees.  Raelyn Mora, MSN, CNM 01/16/2017, 11:25 PM

## 2017-06-08 ENCOUNTER — Encounter (HOSPITAL_COMMUNITY): Payer: Self-pay | Admitting: Emergency Medicine

## 2017-06-08 ENCOUNTER — Other Ambulatory Visit: Payer: Self-pay

## 2017-06-08 DIAGNOSIS — R112 Nausea with vomiting, unspecified: Secondary | ICD-10-CM | POA: Diagnosis present

## 2017-06-08 DIAGNOSIS — R509 Fever, unspecified: Secondary | ICD-10-CM | POA: Diagnosis not present

## 2017-06-08 DIAGNOSIS — A084 Viral intestinal infection, unspecified: Secondary | ICD-10-CM | POA: Diagnosis not present

## 2017-06-08 LAB — BASIC METABOLIC PANEL
Anion gap: 9 (ref 5–15)
BUN: 13 mg/dL (ref 6–20)
CHLORIDE: 107 mmol/L (ref 101–111)
CO2: 25 mmol/L (ref 22–32)
CREATININE: 0.55 mg/dL (ref 0.44–1.00)
Calcium: 8.9 mg/dL (ref 8.9–10.3)
GFR calc Af Amer: >60 mL/min (ref >60)
GFR calc non Af Amer: >60 mL/min (ref >60)
Glucose, Bld: 95 mg/dL (ref 65–99)
POTASSIUM: 3.6 mmol/L (ref 3.5–5.1)
SODIUM: 141 mmol/L (ref 135–145)

## 2017-06-08 LAB — CBC WITH DIFFERENTIAL/PLATELET
Basophils Absolute: 0 10*3/uL (ref 0.0–0.1)
Basophils Relative: 0 %
Eosinophils Absolute: 0 10*3/uL (ref 0.0–0.7)
Eosinophils Relative: 0 %
HCT: 40 % (ref 36.0–46.0)
HEMOGLOBIN: 13.3 g/dL (ref 12.0–15.0)
LYMPHS ABS: 0.6 10*3/uL — AB (ref 0.7–4.0)
LYMPHS PCT: 4 %
MCH: 26.9 pg (ref 26.0–34.0)
MCHC: 33.3 g/dL (ref 30.0–36.0)
MCV: 80.8 fL (ref 78.0–100.0)
MONOS PCT: 2 %
Monocytes Absolute: 0.3 10*3/uL (ref 0.1–1.0)
NEUTROS ABS: 13.3 10*3/uL — AB (ref 1.7–7.7)
NEUTROS PCT: 94 %
Platelets: 298 10*3/uL (ref 150–400)
RBC: 4.95 MIL/uL (ref 3.87–5.11)
RDW: 14 % (ref 11.5–15.5)
WBC: 14.2 10*3/uL — AB (ref 4.0–10.5)

## 2017-06-08 NOTE — ED Triage Notes (Signed)
Pt arriving from home with complaint of N/V and chills x1 day. Pt reports children have been sick as well. Pt reports seeing some blood the last time she threw up today approx 30 minutes ago.

## 2017-06-09 ENCOUNTER — Emergency Department (HOSPITAL_COMMUNITY)
Admission: EM | Admit: 2017-06-09 | Discharge: 2017-06-09 | Disposition: A | Discharge status: Home / Self Care | Payer: 59 | Attending: Emergency Medicine | Admitting: Emergency Medicine

## 2017-06-09 DIAGNOSIS — A084 Viral intestinal infection, unspecified: Secondary | ICD-10-CM

## 2017-06-09 LAB — I-STAT BETA HCG BLOOD, ED (MC, WL, AP ONLY)

## 2017-06-09 MED ORDER — ONDANSETRON HCL 4 MG/2ML IJ SOLN
4.0000 mg | Freq: Once | INTRAMUSCULAR | Status: AC
  Administered 2017-06-09: 4 mg via INTRAVENOUS
  Filled 2017-06-09: qty 2

## 2017-06-09 MED ORDER — ONDANSETRON 8 MG PO TBDP: 8.0000 mg | ORAL_TABLET | Freq: Three times a day (TID) | ORAL | 0 refills | Status: DC | PRN

## 2017-06-09 MED ORDER — SODIUM CHLORIDE 0.9 % IV BOLUS (SEPSIS)
1000.0000 mL | Freq: Once | INTRAVENOUS | Status: AC
  Administered 2017-06-09: 1000 mL via INTRAVENOUS

## 2017-06-09 NOTE — ED Provider Notes (Signed)
[facility]-EMERGENCY DEPT Provider Note: Lowella Dell, MD, FACEP  CSN: [id number] MRN: [id number] ARRIVAL: 06/08/17 at 2009 ROOM: WA14/WA14   CHIEF COMPLAINT  Vomiting   HISTORY OF PRESENT ILLNESS  06/09/17 1:15 AM Gail Johnson is a 27 y.o. female with nausea, vomiting and diarrhea which began yesterday afternoon about 2 PM.  She estimates she has had 8 or 9 episodes of vomiting, the last about 8 PM yesterday evening.  There was some blood streaking in the last episode.  She had associated abdominal cramping and lower back pain which she rated as a 6 out of 10.  The pain has improved.  She is complaining of chills and had a low-grade fever on arrival.  She was noted to be tachycardic on arrival.  She has had family members with gastroenteritis recently.   Past Medical History:  Diagnosis Date  . Anemia   . Anxiety   . BV (bacterial vaginosis) 07/04/2014  . Chlamydia   . HSV-2 infection   . Infection   . Multiple allergies    gts hives  . Vaginal discharge 07/04/2014    Past Surgical History:  Procedure Laterality Date  . NO PAST SURGERIES      Family History  Problem Relation Age of Onset  . Hypertension Maternal Grandmother   . Heart disease Maternal Grandfather   . Hypertension Paternal Grandmother   . Diabetes Paternal Grandmother   . Breast cancer Paternal Grandmother     Social History   Tobacco Use  . Smoking status: Never Smoker  . Smokeless tobacco: Never Used  Substance Use Topics  . Alcohol use: Yes    Comment: mixed drink on 01/08/17  . Drug use: No    Prior to Admission medications   Medication Sig Start Date End Date Taking? Authorizing Provider  ibuprofen (ADVIL,MOTRIN) 600 MG tablet Take 1 tablet (600 mg total) by mouth every 6 (six) hours as needed. 01/02/17   Gerald Leitz, MD  Prenatal Vit-Fe Fumarate-FA (PNV PRENATAL PLUS MULTIVITAMIN) 27-1 MG TABS Take 1 tablet by mouth daily. 08/20/14   Cresenzo-Dishmon, Scarlette Calico, CNM    Allergies Patient has no  known allergies.   REVIEW OF SYSTEMS  Negative except as noted here or in the History of Present Illness.   PHYSICAL EXAMINATION  Initial Vital Signs Blood pressure 120/75, pulse (!) 126, temperature (!) 100.9 F (38.3 C), temperature source Oral, resp. rate 18, SpO2 96 %, currently breastfeeding.  Examination General: Well-developed, well-nourished female in no acute distress; appearance consistent with age of record HENT: normocephalic; atraumatic Eyes: pupils equal, round and reactive to light; extraocular muscles intact Neck: supple Heart: regular rate and rhythm; tachycardia Lungs: clear to auscultation bilaterally Abdomen: soft; nondistended; nontender; no masses or hepatosplenomegaly; bowel sounds present Extremities: No deformity; full range of motion; pulses normal Neurologic: Awake, alert and oriented; motor function intact in all extremities and symmetric; no facial droop Skin: Warm and dry Psychiatric: Normal mood and affect   RESULTS  Summary of this visit's results, reviewed by myself:   EKG Interpretation  Date/Time:    Ventricular Rate:    PR Interval:    QRS Duration:   QT Interval:    QTC Calculation:   R Axis:     Text Interpretation:        Laboratory Studies: Results for orders placed or performed during the hospital encounter of 06/09/17 (from the past 24 hour(s))  CBC with Differential     Status: Abnormal   Collection Time: 06/08/17  9:11 PM  Result Value Ref Range   WBC 14.2 (H) 4.0 - 10.5 K/uL   RBC 4.95 3.87 - 5.11 MIL/uL   Hemoglobin 13.3 12.0 - 15.0 g/dL   HCT 16.140.0 09.636.0 - 04.546.0 %   MCV 80.8 78.0 - 100.0 fL   MCH 26.9 26.0 - 34.0 pg   MCHC 33.3 30.0 - 36.0 g/dL   RDW 40.914.0 81.111.5 - 91.415.5 %   Platelets 298 150 - 400 K/uL   Neutrophils Relative % 94 %   Neutro Abs 13.3 (H) 1.7 - 7.7 K/uL   Lymphocytes Relative 4 %   Lymphs Abs 0.6 (L) 0.7 - 4.0 K/uL   Monocytes Relative 2 %   Monocytes Absolute 0.3 0.1 - 1.0 K/uL   Eosinophils  Relative 0 %   Eosinophils Absolute 0.0 0.0 - 0.7 K/uL   Basophils Relative 0 %   Basophils Absolute 0.0 0.0 - 0.1 K/uL  Basic metabolic panel     Status: None   Collection Time: 06/08/17  9:11 PM  Result Value Ref Range   Sodium 141 135 - 145 mmol/L   Potassium 3.6 3.5 - 5.1 mmol/L   Chloride 107 101 - 111 mmol/L   CO2 25 22 - 32 mmol/L   Glucose, Bld 95 65 - 99 mg/dL   BUN 13 6 - 20 mg/dL   Creatinine, Ser 7.820.55 0.44 - 1.00 mg/dL   Calcium 8.9 8.9 - 95.610.3 mg/dL   GFR calc non Af Amer >60 >60 mL/min   GFR calc Af Amer >60 >60 mL/min   Anion gap 9 5 - 15  I-Stat Beta hCG blood, ED ([facility], [facility], [facility] only)     Status: None   Collection Time: 06/09/17  1:50 AM  Result Value Ref Range   I-stat hCG, quantitative <5.0 <5 mIU/mL   Comment 3           Imaging Studies: No results found.  ED COURSE  Nursing notes and initial vitals signs, including pulse oximetry, reviewed.  Vitals:   06/08/17 2050 06/09/17 0006 06/09/17 0216  BP: (!) 140/107 120/75 114/65  Pulse: 99 (!) 126 (!) 113  Resp: 20 18 18   Temp: 98.3 F (36.8 C) (!) 100.9 F (38.3 C)   TempSrc: Oral Oral   SpO2: 100% 96% 100%   2:33 AM She feeling better after IV fluid bolus and Zofran.  She is able to drink fluids without emesis.  Patient's history and sick contacts are consistent with an acute viral gastroenteritis.  PROCEDURES    ED DIAGNOSES     ICD-10-CM   1. Viral gastroenteritis A08.4        Gail Somera, MD 06/09/17 [phone]

## 2020-10-15 ENCOUNTER — Encounter: Payer: Self-pay | Admitting: Neurology

## 2020-10-15 ENCOUNTER — Ambulatory Visit: Payer: Medicaid Other | Admitting: Neurology

## 2020-10-19 ENCOUNTER — Telehealth: Payer: Self-pay

## 2020-10-19 NOTE — Telephone Encounter (Signed)
Pt no showed 10/15/20 appt with Dr. Frances Furbish.

## 2021-08-31 ENCOUNTER — Encounter: Payer: Self-pay | Admitting: Neurology

## 2021-09-08 NOTE — Progress Notes (Signed)
? ?NEUROLOGY CONSULTATION NOTE ? ?Gail Johnson ?MRN: [id number] ?DOB: 01/27/1991 ? ?Referring provider: Jarrett Soho, PA-C ?Primary care provider: Jarrett Soho, PA-C ? ?Reason for consult:  migraine ? ?Assessment/Plan:  ? ?Migraine without aura, without status migrainosus, intractable ? ?Do to the frequency and intractability of her headaches with family history of cerebral aneurysm, will check MRI and MRA of brain ?As she has had a persistent migraine since Tuesday, will give her a Toradol 60mg  shot today in the office.  If headache still present tomorrow, she will start a prednisone taper.  Advised not to take other NSAIDs during this time. ?Migraine prevention:  start nortriptyline 25mg  at bedtime.  Increase to 50mg  at bedtime in 6 weeks if needed. ?Migraine rescue:  rizatriptan 10mg , Zofran 8mg  for nausea ?Limit use of pain relievers to no more than 2 days out of week to prevent risk of rebound or medication-overuse headache. ?Keep headache diary ?Follow up 4-5 months. ? ? ? ?Subjective:  ?Gail Johnson is a 31 year old female who presents for migraines.  History supplemented by referring provider's note. ? ?Onset:  middle school ?Location:  right temple/periorbital ?Quality:  throbbing ?Intensity:  8.5/10.  ?Aura:  absent ?Prodrome:  absent ?Associated symptoms:  nausea, vomiting, photophobia, phonophobia, sees spots.  She denies associated unilateral numbness or weakness. ?Duration:  until she goes to bed and wakes up next day  Currently with a migraine ongoing since Tuesday. ?Frequency:  May go a week without one.  Usually occurs twice a week ?Frequency of abortive medication: usually only once a week ?Triggers:  caffeine, hormonal, weather ?Relieving factors:  sleep ?Activity:  aggravates ? ?Current NSAIDS/analgesics:  Excedrin ?Current triptans:  none ?Current ergotamine:  none ?Current anti-emetic:  Zofran 8mg  ?Current muscle relaxants:  none ?Current Antihypertensive medications:   none ?Current Antidepressant medications:  none ?Current Anticonvulsant medications:  none ?Current anti-CGRP:  none ?Current Vitamins/Herbal/Supplements:  none ?Current Antihistamines/Decongestants:  Zyrtec ?Other therapy:  none ?Hormone/birth control:  Junel ? ? ?Past NSAIDS/analgesics:  ibuprofen, acetaminophen ?Past abortive triptans:  sumatriptan tab ?Past abortive ergotamine:  none ?Past muscle relaxants:  Robaxin ?Past anti-emetic:  Zofran ODT 8mg  ?Past antihypertensive medications:  none ?Past antidepressant medications:  none ?Past anticonvulsant medications:  none ?Past anti-CGRP:  none ?Past vitamins/Herbal/Supplements:  none ?Past antihistamines/decongestants:  Benadryl ?Other past therapies:  none ? ?Caffeine:  No coffee.  Tea ?Alcohol:  occasional ?Smoker:  no ?Diet:  Tries to drink water.  No soda ?Exercise:  gym twice a week ?Depression:  no; Anxiety:  yes ?Other pain:  neck cramps ?Sleep hygiene:  okay but sleeps 4-5 hours a night due to work, school and caring for her kids. ?Currently in nursing school ?Family history of headache:  mother's side.  Grandfather had a cerebral aneurysm.   ? ?  ? ? ?PAST MEDICAL HISTORY: ?Past Medical History:  ?Diagnosis Date  ? Anemia   ? Anxiety   ? BV (bacterial vaginosis) 07/04/2014  ? Chlamydia   ? HSV-2 infection   ? Infection   ? Multiple allergies   ? gts hives  ? Vaginal discharge 07/04/2014  ? ? ?PAST SURGICAL HISTORY: ?Past Surgical History:  ?Procedure Laterality Date  ? NO PAST SURGERIES    ? ? ?MEDICATIONS: ?Current Outpatient Medications on File Prior to Visit  ?Medication Sig Dispense Refill  ? ibuprofen (ADVIL,MOTRIN) 600 MG tablet Take 1 tablet (600 mg total) by mouth every 6 (six) hours as needed. 30 tablet 1  ? ondansetron (ZOFRAN ODT)  8 MG disintegrating tablet Take 1 tablet (8 mg total) by mouth every 8 (eight) hours as needed for nausea or vomiting. 10 tablet 0  ? ?No current facility-administered medications on file prior to visit.   ? ? ?ALLERGIES: ?No Known Allergies ? ?FAMILY HISTORY: ?Family History  ?Problem Relation Age of Onset  ? Hypertension Maternal Grandmother   ? Heart disease Maternal Grandfather   ? Hypertension Paternal Grandmother   ? Diabetes Paternal Grandmother   ? Breast cancer Paternal Grandmother   ? ? ?Objective:  ?Blood pressure 128/81, pulse 76, height 5\' 6"  (1.676 m), weight 154 lb 9.6 oz (70.1 kg), SpO2 99 %, currently breastfeeding. ?General: No acute distress.  Patient appears well-groomed.   ?Head:  Normocephalic/atraumatic ?Eyes:  fundi examined but not visualized ?Neck: supple, no paraspinal tenderness, full range of motion ?Back: No paraspinal tenderness ?Heart: regular rate and rhythm ?Lungs: Clear to auscultation bilaterally. ?Vascular: No carotid bruits. ?Neurological Exam: ?Mental status: alert and oriented to person, place, and time, recent and remote memory intact, fund of knowledge intact, attention and concentration intact, speech fluent and not dysarthric, language intact. ?Cranial nerves: ?CN I: not tested ?CN II: pupils equal, round and reactive to light, visual fields intact ?CN III, IV, VI:  full range of motion, no nystagmus, no ptosis ?CN V: facial sensation intact. ?CN VII: upper and lower face symmetric ?CN VIII: hearing intact ?CN IX, X: gag intact, uvula midline ?CN XI: sternocleidomastoid and trapezius muscles intact ?CN XII: tongue midline ?Bulk & Tone: normal, no fasciculations. ?Motor:  muscle strength 5/5 throughout ?Sensation:  Pinprick, temperature and vibratory sensation intact. ?Deep Tendon Reflexes:  2+ throughout,  toes downgoing.   ?Finger to nose testing:  Without dysmetria.   ?Heel to shin:  Without dysmetria.   ?Gait:  Normal station and stride.  Romberg negative. ? ? ? ?Thank you for allowing me to take part in the care of this patient. ? ? , DO ? ?CC: Shon Millet, PA-C ? ? ? ? ?

## 2021-09-10 ENCOUNTER — Encounter: Payer: Self-pay | Admitting: Neurology

## 2021-09-10 ENCOUNTER — Ambulatory Visit (INDEPENDENT_AMBULATORY_CARE_PROVIDER_SITE_OTHER): Payer: No Typology Code available for payment source | Admitting: Neurology

## 2021-09-10 VITALS — BP 128/81 | HR 76 | Ht 66.0 in | Wt 154.6 lb

## 2021-09-10 DIAGNOSIS — G43019 Migraine without aura, intractable, without status migrainosus: Secondary | ICD-10-CM | POA: Diagnosis not present

## 2021-09-10 MED ORDER — RIZATRIPTAN BENZOATE 10 MG PO TABS: 10.0000 mg | ORAL_TABLET | ORAL | 5 refills | Status: DC | PRN

## 2021-09-10 MED ORDER — NORTRIPTYLINE HCL 25 MG PO CAPS: 25.0000 mg | ORAL_CAPSULE | Freq: Every day | ORAL | 5 refills | Status: DC

## 2021-09-10 MED ORDER — PREDNISONE 10 MG (21) PO TBPK: ORAL_TABLET | ORAL | 0 refills | Status: DC

## 2021-09-10 MED ORDER — PREDNISONE 10 MG PO TABS: ORAL_TABLET | ORAL | 0 refills | Status: DC

## 2021-09-10 MED ORDER — KETOROLAC TROMETHAMINE 60 MG/2ML IM SOLN
60.0000 mg | Freq: Once | INTRAMUSCULAR | Status: AC
  Administered 2021-09-10: 60 mg via INTRAMUSCULAR

## 2021-09-10 NOTE — Patient Instructions (Addendum)
?  Check MRI and MRA of head. ?To try and break this intractable migraine, will give you a Toradol shot.  If headache doesn't break, then tomorrow start a prednisone taper.  Do not take NSAIDs such as ibuprofen or Excedrin today or during the prednisone taper. ?Start nortriptyline 25mg  at bedtime.  Contact in 6 weeks with update and we can increase dose if needed. ?Take rizatriptan at earliest onset of headache.  May repeat dose once in 2 hours if needed.  Maximum 2 tablets in 24 hours. ?Continue ondansetron ?Limit use of pain relievers to no more than 2 days out of the week.  These medications include acetaminophen, NSAIDs (ibuprofen/Advil/Motrin, naproxen/Aleve, triptans (Imitrex/sumatriptan), Excedrin, and narcotics.  This will help reduce risk of rebound headaches. ?Be aware of common food triggers: ? - Caffeine:  coffee, black tea, cola, Mt. Dew ? - Chocolate ? - Dairy:  aged cheeses (brie, blue, cheddar, gouda, [place], provolone, [place], Swiss, etc), chocolate milk, buttermilk, sour cream, limit eggs and yogurt ? - Nuts, peanut butter ? - Alcohol ? - Cereals/grains:  FRESH breads (fresh bagels, sourdough, doughnuts), yeast productions ? - Processed/canned/aged/cured meats (pre-packaged deli meats, hotdogs) ? - MSG/glutamate:  soy sauce, flavor enhancer, pickled/preserved/marinated foods ? - Sweeteners:  aspartame (Equal, Nutrasweet).  Sugar and Splenda are okay ? - Vegetables:  legumes (lima beans, lentils, snow peas, fava beans, pinto peans, peas, garbanzo beans), sauerkraut, onions, olives, pickles ? - Fruit:  avocados, bananas, citrus fruit (orange, lemon, grapefruit), mango ? - Other:  Frozen meals, macaroni and cheese ?Routine exercise ?Stay adequately hydrated (aim for 64 oz water daily) ?Keep headache diary ?Maintain proper stress management ?Maintain proper sleep hygiene ?Do not skip meals ?Consider supplements:  magnesium citrate 400mg  daily, riboflavin 400mg  daily, coenzyme Q10 100mg  three times  daily. ? ?

## 2021-09-26 ENCOUNTER — Ambulatory Visit
Admission: RE | Admit: 2021-09-26 | Discharge: 2021-09-26 | Disposition: A | Discharge status: Home / Self Care | Payer: No Typology Code available for payment source | Source: Ambulatory Visit | Attending: Neurology | Admitting: Neurology

## 2021-09-26 DIAGNOSIS — G43019 Migraine without aura, intractable, without status migrainosus: Secondary | ICD-10-CM

## 2021-09-27 NOTE — Progress Notes (Signed)
Patient called and informed of MRI she understood, said she had seen it on MY Chart

## 2021-10-02 ENCOUNTER — Other Ambulatory Visit: Payer: Self-pay | Admitting: Neurology

## 2022-01-25 ENCOUNTER — Other Ambulatory Visit: Payer: Self-pay | Admitting: Neurology

## 2022-01-25 NOTE — Telephone Encounter (Signed)
Gave only enough until visit with Dr.Jaffe

## 2022-02-07 ENCOUNTER — Other Ambulatory Visit: Payer: Self-pay | Admitting: Neurology

## 2022-02-10 ENCOUNTER — Ambulatory Visit: Payer: No Typology Code available for payment source | Admitting: Neurology

## 2022-02-15 NOTE — Progress Notes (Deleted)
NEUROLOGY FOLLOW UP OFFICE NOTE  Gail Johnson [id number]  Assessment/Plan:   Migraine without aura, without status migrainosus, intractable   Migraine prevention:  start nortriptyline 25mg  at bedtime.  Increase to 50mg  at bedtime in 6 weeks if needed. Migraine rescue:  rizatriptan 10mg , Zofran 8mg  for nausea Limit use of pain relievers to no more than 2 days out of week to prevent risk of rebound or medication-overuse headache. Keep headache diary Follow up 4-5 months.       Subjective:  Gail Johnson is a 31 year old female who follows up for migraines.  UPDATE: MRI and MRA of brain on 09/26/2021 personally reviewed were normal.  Started nortriptyline and rizatriptan. Intensity:  *** Duration:  *** Frequency:  *** Frequency of abortive medication: *** Current NSAIDS/analgesics:  Excedrin Current triptans:  rizatriptan 10mg  Current ergotamine:  none Current anti-emetic:  Zofran 8mg  Current muscle relaxants:  none Current Antihypertensive medications:  none Current Antidepressant medications:  nortriptyline 10mg  QHS Current Anticonvulsant medications:  none Current anti-CGRP:  none Current Vitamins/Herbal/Supplements:  none Current Antihistamines/Decongestants:  Zyrtec Other therapy:  none Hormone/birth control:  Junel  Caffeine:  No coffee.  Tea Alcohol:  occasional Smoker:  no Diet:  Tries to drink water.  No soda Exercise:  gym twice a week Depression:  no; Anxiety:  yes Other pain:  neck cramps Sleep hygiene:  okay but sleeps 4-5 hours a night due to work, school and caring for her kids. Currently in nursing school  HISTORY: Onset:  middle school Location:  right temple/periorbital Quality:  throbbing Intensity:  8.5/10.  Aura:  absent Prodrome:  absent Associated symptoms:  nausea, vomiting, photophobia, phonophobia, sees spots.  She denies associated unilateral numbness or weakness. Duration:  until she goes to bed and wakes up next day   Currently with a migraine ongoing since Tuesday. Frequency:  May go a week without one.  Usually occurs twice a week Frequency of abortive medication: usually only once a week Triggers:  caffeine, hormonal, weather Relieving factors:  sleep Activity:  aggravates      Past NSAIDS/analgesics:  ibuprofen, acetaminophen Past abortive triptans:  sumatriptan tab Past abortive ergotamine:  none Past muscle relaxants:  Robaxin Past anti-emetic:  Zofran ODT 8mg  Past antihypertensive medications:  none Past antidepressant medications:  none Past anticonvulsant medications:  none Past anti-CGRP:  none Past vitamins/Herbal/Supplements:  none Past antihistamines/decongestants:  Benadryl Other past therapies:  none    Family history of headache:  mother's side.  Grandfather had a cerebral aneurysm.    PAST MEDICAL HISTORY: Past Medical History:  Diagnosis Date   Anemia    Anxiety    BV (bacterial vaginosis) 07/04/2014   Chlamydia    HSV-2 infection    Infection    Multiple allergies    gts hives   Vaginal discharge 07/04/2014    MEDICATIONS: Current Outpatient Medications on File Prior to Visit  Medication Sig Dispense Refill   cetirizine (ZYRTEC) 10 MG tablet Take by mouth.     ibuprofen (ADVIL,MOTRIN) 600 MG tablet Take 1 tablet (600 mg total) by mouth every 6 (six) hours as needed. (Patient not taking: Reported on 09/10/2021) 30 tablet 1   JUNEL 1/20 1-20 MG-MCG tablet Take 1 tablet by mouth daily.     nortriptyline (PAMELOR) 25 MG capsule TAKE 1 CAPSULE BY MOUTH EVERYDAY AT BEDTIME 30 capsule 0   ondansetron (ZOFRAN ODT) 8 MG disintegrating tablet Take 1 tablet (8 mg total) by mouth every 8 (eight) hours as  needed for nausea or vomiting. 10 tablet 0   predniSONE (DELTASONE) 10 MG tablet Take 60mg  on day 1, then 50mg  on day 2, then 40mg  on day 3, then 30mg  on day 4, then 20mg  on day 5, then 10mg  on day 6, then STOP 21 tablet 0   predniSONE (STERAPRED UNI-PAK 21 TAB) 10 MG (21) TBPK  tablet take 60mg  day 1, then 50mg  day 2, then 40mg  day 3, then 30mg  day 4, then 20mg  day 5, then 10mg  day 6, then STOP 21 tablet 0   ranitidine (ZANTAC) 150 MG tablet Take by mouth.     rizatriptan (MAXALT) 10 MG tablet Take 1 tablet (10 mg total) by mouth as needed for migraine (May repeat after 2 hours.  Maximum 2 tablets in 24 hours.). May repeat in 2 hours if needed 10 tablet 5   valACYclovir (VALTREX) 500 MG tablet Take 500 mg by mouth as needed.     No current facility-administered medications on file prior to visit.    ALLERGIES: No Known Allergies  FAMILY HISTORY: Family History  Problem Relation Age of Onset   Hypertension Maternal Grandmother    Heart disease Maternal Grandfather    Hypertension Paternal Grandmother    Diabetes Paternal Grandmother    Breast cancer Paternal Grandmother       Objective:  *** General: No acute distress.  Patient appears ***-groomed.   Head:  Normocephalic/atraumatic Eyes:  Fundi examined but not visualized Neck: supple, no paraspinal tenderness, full range of motion Heart:  Regular rate and rhythm Lungs:  Clear to auscultation bilaterally Back: No paraspinal tenderness Neurological Exam: alert and oriented to person, place, and time.  Speech fluent and not dysarthric, language intact.  CN II-XII intact. Bulk and tone normal, muscle strength 5/5 throughout.  Sensation to light touch intact.  Deep tendon reflexes 2+ throughout, toes downgoing.  Finger to nose testing intact.  Gait normal, Romberg negative.   Metta Clines, DO  CC: ***

## 2022-02-17 ENCOUNTER — Encounter: Payer: Self-pay | Admitting: Neurology

## 2022-02-17 ENCOUNTER — Ambulatory Visit: Payer: Medicaid Other | Admitting: Neurology

## 2022-02-17 DIAGNOSIS — Z029 Encounter for administrative examinations, unspecified: Secondary | ICD-10-CM

## 2022-03-22 ENCOUNTER — Ambulatory Visit (INDEPENDENT_AMBULATORY_CARE_PROVIDER_SITE_OTHER): Payer: Medicaid Other | Admitting: Internal Medicine

## 2022-03-22 ENCOUNTER — Encounter: Payer: Self-pay | Admitting: Internal Medicine

## 2022-03-22 VITALS — BP 122/90 | HR 73 | Temp 98.6°F | Resp 20 | Ht 65.5 in | Wt 152.1 lb

## 2022-03-22 DIAGNOSIS — L501 Idiopathic urticaria: Secondary | ICD-10-CM

## 2022-03-22 DIAGNOSIS — T783XXA Angioneurotic edema, initial encounter: Secondary | ICD-10-CM

## 2022-03-22 NOTE — Progress Notes (Signed)
NEW PATIENT  Date of Service/Encounter:  03/22/22  Consult requested by: [facility] [place]   Subjective:   Gail Johnson (DOB: [birth year]) is a 31 y.o. female who presents to the clinic on 03/22/2022 with a chief complaint of Urticaria (Chronic idiopathic ) and Establish Care .    History obtained from: chart review and patient.  Hives/Angioedema: Reports onset around 2011.  She has seen multiple physicians for this. Reports initially being told it was related to shellfish/orange allergy so she stopped eating those without any improvement.  Then she was told it was related to red meat allergy so she stopped eating red meat without any improvement.  She has since reintroduced those into her diet.    Most recently, started seeing [facility] about 2 years ago.  She had some trouble with insurance and billing department there so she would like to transfer care. She has had skin testing in the past but can't recall results.  She still has frequent flare ups of hives and extremity swelling. Also requires frequent prednisone courses for flare ups.  Currently on prednisone due to foot swelling and hives.  She is holding Xyzal for this appointment for testing.  Reports in the past Xyzal was helping but not recently.  She has tried other anti histamines like Zyrtec, Ranitidine, Claritin without much relief.  She did do Xolair for about 6 months and it worked well but last shot was about 1 year ago.     Past Medical History: Past Medical History:  Diagnosis Date   Anemia    Anxiety    BV (bacterial vaginosis) 07/04/2014   Chlamydia    HSV-2 infection    Infection    Multiple allergies    gts hives   Urticaria    Vaginal discharge 07/04/2014   Past Surgical History: Past Surgical History:  Procedure Laterality Date   NO PAST SURGERIES      Family History: Family History  Problem Relation Age of Onset   Hypertension Maternal Grandmother    Heart disease  Maternal Grandfather    Hypertension Paternal Grandmother    Diabetes Paternal Grandmother    Breast cancer Paternal Grandmother     Social History:  Lives in a 32 year house 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 If you have any questions, ask your nurse or doctor.          cetirizine 10 MG tablet Commonly known as: ZYRTEC Take by mouth.   ibuprofen 600 MG tablet Commonly known as: ADVIL Take 1 tablet (600 mg total) by mouth every 6 (six) hours as needed.   Junel 1/20 1-20 MG-MCG tablet Generic drug: norethindrone-ethinyl estradiol Take 1 tablet by mouth daily.   nortriptyline 25 MG capsule Commonly known as: PAMELOR TAKE 1 CAPSULE BY MOUTH EVERYDAY AT BEDTIME   ondansetron 8 MG disintegrating tablet Commonly known as: Zofran ODT Take 1 tablet (8 mg total) by mouth every 8 (eight) hours as needed for nausea or vomiting.   predniSONE 10 MG tablet Commonly known as: DELTASONE Take 60mg  on day 1, then 50mg  on day 2, then 40mg  on day 3, then 30mg  on day 4, then 20mg  on day 5, then 10mg  on day 6, then STOP   predniSONE  10 MG (21) Tbpk tablet Commonly known as: STERAPRED UNI-PAK 21 TAB take 60mg  day 1, then 50mg  day 2, then 40mg  day 3, then 30mg  day 4, then 20mg  day 5, then 10mg  day 6, then STOP   ranitidine 150 MG tablet Commonly known as: ZANTAC Take by mouth.   rizatriptan 10 MG tablet Commonly known as: Maxalt Take 1 tablet (10 mg total) by mouth as needed for migraine (May repeat after 2 hours.  Maximum 2 tablets in 24 hours.). May repeat in 2 hours if needed   Valtrex 500 MG tablet Generic drug: valACYclovir Take 500 mg by mouth as needed.         REVIEW OF SYSTEMS: Pertinent positives and negatives discussed in HPI.   Objective:   Physical Exam: BP (!) 122/90    Pulse 73   Temp 98.6 F (37 C)   Resp 20   Ht 5' 5.5" (1.664 m)   Wt 152 lb 1.6 oz (69 kg)   SpO2 99%   BMI 24.93 kg/m  Body mass index is 24.93 kg/m. GEN: alert, well developed HEENT: clear conjunctiva, TM grey and translucent, nose with + inferior turbinate hypertrophy, pale nasal mucosa, slight clear rhinorrhea, + cobblestoning HEART: regular rate and rhythm, no murmur LUNGS: clear to auscultation bilaterally, no coughing, unlabored respiration ABDOMEN: soft, non distended  SKIN: no rashes or lesions  Reviewed:  12/2015: seen [facility] for CIU. Plan to do zyrtec 10mg  BID and ranitiidne 150mg  BID. Mildly elevated tryptase in the past.     Assessment:   1. Idiopathic urticaria   2. Angioedema, initial encounter     Plan/Recommendations:   Idiopathic Urticaria/Angioedema (Hives/Swelling): - Okay to finish out the prednisone course.  She had a good histamine response today but had to go back to work so will do testing on Friday. - Hold all anti histamines until next visit.  Will do 1-59 aeroallergen testing at the time. - Afterwards, start Allegra 360mg  twice daily and Pepcid 40mg  twice daily. - Will discuss Xolair re-initiation at the time. She has had multiple courses of prednisone for uncontrolled hives.  Has also tried anti histamines without improvement.  - Will need to discuss repeat tryptase as chart review today showed mildly elevated at 13 in 2017. 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 Will also do CSU workup at the time (CBC, CMP, tryptase, ANA, C3/C4, TSH, CSU Ab).   Return in about 3 days (around 03/25/2022).       Return in about 3 days (around 03/25/2022).  01/2016, MD [facility]

## 2022-03-22 NOTE — Patient Instructions (Addendum)
Idiopathic Urticaria/Angioedema (Hives/Swelling): - Okay to finish out the prednisone course. - Hold all anti histamines until next visit.    Return in about 3 days (around 03/25/2022).

## 2022-03-25 ENCOUNTER — Ambulatory Visit (INDEPENDENT_AMBULATORY_CARE_PROVIDER_SITE_OTHER): Payer: Medicaid Other | Admitting: Internal Medicine

## 2022-03-25 ENCOUNTER — Encounter: Payer: Self-pay | Admitting: Internal Medicine

## 2022-03-25 VITALS — BP 110/76 | HR 82 | Temp 98.1°F | Resp 18

## 2022-03-25 DIAGNOSIS — L501 Idiopathic urticaria: Secondary | ICD-10-CM | POA: Diagnosis not present

## 2022-03-25 MED ORDER — FEXOFENADINE HCL 180 MG PO TABS: 360.0000 mg | ORAL_TABLET | Freq: Two times a day (BID) | ORAL | 5 refills | Status: DC

## 2022-03-25 MED ORDER — FAMOTIDINE 40 MG PO TABS: 40.0000 mg | ORAL_TABLET | Freq: Two times a day (BID) | ORAL | 5 refills | Status: DC

## 2022-03-25 MED ORDER — HYDROXYZINE HCL 10 MG PO TABS: 10.0000 mg | ORAL_TABLET | Freq: Every evening | ORAL | 5 refills | Status: DC | PRN

## 2022-03-25 MED ORDER — EPINEPHRINE 0.3 MG/0.3ML IJ SOAJ: 0.3000 mg | INTRAMUSCULAR | 1 refills | Status: DC | PRN

## 2022-03-25 NOTE — Progress Notes (Addendum)
FOLLOW UP Date of Service/Encounter:  03/25/22   Subjective:  Gail Johnson (DOB: 07/04/1990) is a 31 y.o. female who returns to the [facility] on 03/25/2022 for follow up for skin testing.  She is still having frequent hives and almost daily swelling episodes involving the lip.  Swelling can involve her face and extremities.    History obtained from: chart review and patient.   Past Medical History: Past Medical History:  Diagnosis Date   Anemia    Anxiety    BV (bacterial vaginosis) 07/04/2014   Chlamydia    HSV-2 infection    Infection    Multiple allergies    gts hives   Urticaria    Vaginal discharge 07/04/2014    Objective:  BP 110/76   Pulse 82   Temp 98.1 F (36.7 C) (Temporal)   Resp 18   SpO2 99%  There is no height or weight on file to calculate BMI. Physical Exam: GEN: alert, well developed HEENT: clear conjunctiva, MMM, swelling of L face HEART: regular rate and rhythm, no murmur LUNGS: clear to auscultation bilaterally, no coughing, unlabored respiration SKIN: no active hives   Skin Testing:  Skin prick testing was placed, which includes aeroallergens/foods, histamine control, and saline control.  Verbal consent was obtained prior to placing test.  Patient tolerated procedure well.  Allergy testing results were read and interpreted by myself, documented by clinical staff. Adequate positive and negative control.  Positive results to:  Results discussed with patient/family.  Airborne Adult Perc - 03/25/22 0842     Time Antigen Placed 0840    Allergen Manufacturer Lavella Hammock    Location Back    Number of Test 59    1. Control-Buffer 50% Glycerol Negative    2. Control-Histamine 1 mg/ml 3+    3. Albumin saline Negative    4. [place] Negative    5. [place] Negative    6. Johnson Negative    7. [place] Blue Negative    8. Meadow Fescue Negative    9. Perennial Rye Negative    10. Sweet Vernal Negative    11. Timothy Negative     12. Cocklebur Negative    13. Burweed Marshelder Negative    14. Ragweed, short Negative    15. Ragweed, Giant Negative    16. Plantain,  English Negative    17. Lamb's Quarters Negative    18. Sheep Sorrell Negative    19. Rough Pigweed Negative    20. Marsh Elder, Rough Negative    21. Mugwort, Common Negative    22. Ash mix Negative    23. Birch mix Negative    24. Beech American Negative    25. Box, Elder Negative    26. Cedar, red Negative    27. Cottonwood, [place] Negative    28. Elm mix Negative    29. Hickory Negative    30. Maple mix Negative    31. Oak, [place] mix Negative    32. Pecan Pollen Negative    33. Pine mix Negative    34. Sycamore Eastern Negative    35. [place], Black Pollen Negative    41. Drechslera spicifera (Curvularia) Negative    42. Mucor plumbeus Negative    43. Fusarium moniliforme Negative    44. Aureobasidium pullulans (pullulara) Negative    45. Rhizopus oryzae Negative    46. Botrytis cinera Negative    47. Epicoccum nigrum Negative    48. Phoma betae Negative  49. Candida Albicans Negative    50. Trichophyton mentagrophytes Negative    51. Mite, D Farinae  5,000 AU/ml Negative    52. Mite, D Pteronyssinus  5,000 AU/ml Negative    53. Cat Hair 10,000 BAU/ml Negative    54.  Dog Epithelia Negative    55. Mixed Feathers Negative    56. Horse Epithelia Negative    57. Cockroach, German Negative    58. Mouse Negative    59. Tobacco Leaf Negative              Assessment/Plan   Idiopathic Urticaria/Angioedema (Hives/Swelling): - SPT 03/2022 negative.   - Start Allegra 360mg  twice daily and Pepcid 40mg  twice daily. - Can take hydroxyzine 10-30mg  nightly as needed for itching/hives.  Can cause sedation so use at nighttime - Will work on Xolair 300mg  every 4 weeks for uncontrolled urticaria and angioedema.  Bring Epipen at all visits.  She is having daily outbreak of hives and facial angioedema.    Return in about 8 weeks  (around 05/20/2022). , MD  [facility]

## 2022-03-25 NOTE — Patient Instructions (Addendum)
Idiopathic Urticaria (Hives): - Start Allegra 360mg  twice daily and Pepcid 40mg  twice daily. - Can take hydroxyzine 10-30mg  nightly as needed for itching/hives.  Can cause sedation so use at nighttime - Will work on Xolair 300mg  every 4 weeks for uncontrolled urticaria and angioedema.  Bring Epipen for all Xolair shot visit`s.

## 2022-03-28 ENCOUNTER — Telehealth: Payer: Self-pay | Admitting: *Deleted

## 2022-03-28 NOTE — Telephone Encounter (Signed)
Patient returned cal advised approval and submit to [facility] and will reach out when delivery set to make appt to start therapy

## 2022-03-28 NOTE — Telephone Encounter (Signed)
L/m for patient to contact me to advise approval and submit to [facility] for Xolair

## 2022-03-28 NOTE — Telephone Encounter (Signed)
-----   Message from Birder Robson, MD sent at 03/25/2022  9:06 AM EST ----- Gwenyth Bender  I would like to start her on Xolair 300mg  every 4 weeks for chronic idiopathic urticaria/angioedema. She has failed multiple courses of steroids and anti histamines.  Was previously on Xolair with [facility].

## 2022-03-29 ENCOUNTER — Other Ambulatory Visit: Payer: Self-pay

## 2022-03-29 ENCOUNTER — Emergency Department (HOSPITAL_COMMUNITY): Payer: Medicaid Other

## 2022-03-29 ENCOUNTER — Encounter (HOSPITAL_COMMUNITY): Payer: Self-pay

## 2022-03-29 ENCOUNTER — Inpatient Hospital Stay (HOSPITAL_COMMUNITY)
Admission: EM | Admit: 2022-03-29 | Discharge: 2022-03-30 | DRG: 392 | Disposition: A | Discharge status: Home / Self Care | Payer: Medicaid Other | Attending: Student | Admitting: Student

## 2022-03-29 DIAGNOSIS — Z8619 Personal history of other infectious and parasitic diseases: Secondary | ICD-10-CM

## 2022-03-29 DIAGNOSIS — G43909 Migraine, unspecified, not intractable, without status migrainosus: Secondary | ICD-10-CM | POA: Diagnosis present

## 2022-03-29 DIAGNOSIS — K219 Gastro-esophageal reflux disease without esophagitis: Secondary | ICD-10-CM | POA: Diagnosis present

## 2022-03-29 DIAGNOSIS — L501 Idiopathic urticaria: Secondary | ICD-10-CM | POA: Diagnosis present

## 2022-03-29 DIAGNOSIS — K529 Noninfective gastroenteritis and colitis, unspecified: Secondary | ICD-10-CM | POA: Diagnosis present

## 2022-03-29 DIAGNOSIS — Z793 Long term (current) use of hormonal contraceptives: Secondary | ICD-10-CM

## 2022-03-29 DIAGNOSIS — Z79899 Other long term (current) drug therapy: Secondary | ICD-10-CM

## 2022-03-29 DIAGNOSIS — K5900 Constipation, unspecified: Secondary | ICD-10-CM | POA: Diagnosis present

## 2022-03-29 DIAGNOSIS — R1084 Generalized abdominal pain: Principal | ICD-10-CM

## 2022-03-29 DIAGNOSIS — F419 Anxiety disorder, unspecified: Secondary | ICD-10-CM | POA: Diagnosis present

## 2022-03-29 DIAGNOSIS — R8290 Unspecified abnormal findings in urine: Secondary | ICD-10-CM | POA: Diagnosis present

## 2022-03-29 LAB — URINALYSIS, ROUTINE W REFLEX MICROSCOPIC
Bilirubin Urine: NEGATIVE
Glucose, UA: NEGATIVE mg/dL
Ketones, ur: NEGATIVE mg/dL
Nitrite: NEGATIVE
Protein, ur: NEGATIVE mg/dL
Specific Gravity, Urine: 1.024 (ref 1.005–1.030)
Squamous Epithelial / HPF: >50 — ABNORMAL HIGH (ref 0–5)
pH: 5 (ref 5.0–8.0)

## 2022-03-29 LAB — COMPREHENSIVE METABOLIC PANEL
ALT: 16 U/L (ref 0–44)
AST: 11 U/L — ABNORMAL LOW (ref 15–41)
Albumin: 3.3 g/dL — ABNORMAL LOW (ref 3.5–5.0)
Alkaline Phosphatase: 47 U/L (ref 38–126)
Anion gap: 9 (ref 5–15)
BUN: 8 mg/dL (ref 6–20)
CO2: 23 mmol/L (ref 22–32)
Calcium: 8.3 mg/dL — ABNORMAL LOW (ref 8.9–10.3)
Chloride: 102 mmol/L (ref 98–111)
Creatinine, Ser: 0.6 mg/dL (ref 0.44–1.00)
GFR, Estimated: >60 mL/min (ref >60)
Glucose, Bld: 93 mg/dL (ref 70–99)
Potassium: 3.6 mmol/L (ref 3.5–5.1)
Sodium: 134 mmol/L — ABNORMAL LOW (ref 135–145)
Total Bilirubin: 0.3 mg/dL (ref 0.3–1.2)
Total Protein: 7 g/dL (ref 6.5–8.1)

## 2022-03-29 LAB — CBC
HCT: 35.4 % — ABNORMAL LOW (ref 36.0–46.0)
Hemoglobin: 11.3 g/dL — ABNORMAL LOW (ref 12.0–15.0)
MCH: 26.5 pg (ref 26.0–34.0)
MCHC: 31.9 g/dL (ref 30.0–36.0)
MCV: 82.9 fL (ref 80.0–100.0)
Platelets: 409 10*3/uL — ABNORMAL HIGH (ref 150–400)
RBC: 4.27 MIL/uL (ref 3.87–5.11)
RDW: 13.9 % (ref 11.5–15.5)
WBC: 17.9 10*3/uL — ABNORMAL HIGH (ref 4.0–10.5)
nRBC: 0 % (ref 0.0–0.2)

## 2022-03-29 LAB — CBC WITH DIFFERENTIAL/PLATELET
Abs Immature Granulocytes: 0.09 10*3/uL — ABNORMAL HIGH (ref 0.00–0.07)
Basophils Absolute: 0 10*3/uL (ref 0.0–0.1)
Basophils Relative: 0 %
Eosinophils Absolute: 0.1 10*3/uL (ref 0.0–0.5)
Eosinophils Relative: 0 %
HCT: 36.6 % (ref 36.0–46.0)
Hemoglobin: 12 g/dL (ref 12.0–15.0)
Immature Granulocytes: 1 %
Lymphocytes Relative: 17 %
Lymphs Abs: 3.1 10*3/uL (ref 0.7–4.0)
MCH: 26.8 pg (ref 26.0–34.0)
MCHC: 32.8 g/dL (ref 30.0–36.0)
MCV: 81.9 fL (ref 80.0–100.0)
Monocytes Absolute: 0.9 10*3/uL (ref 0.1–1.0)
Monocytes Relative: 5 %
Neutro Abs: 14.4 10*3/uL — ABNORMAL HIGH (ref 1.7–7.7)
Neutrophils Relative %: 77 %
Platelets: 439 10*3/uL — ABNORMAL HIGH (ref 150–400)
RBC: 4.47 MIL/uL (ref 3.87–5.11)
RDW: 13.7 % (ref 11.5–15.5)
WBC: 18.5 10*3/uL — ABNORMAL HIGH (ref 4.0–10.5)
nRBC: 0 % (ref 0.0–0.2)

## 2022-03-29 LAB — PREGNANCY, URINE: Preg Test, Ur: NEGATIVE

## 2022-03-29 LAB — LIPASE, BLOOD: Lipase: 34 U/L (ref 11–51)

## 2022-03-29 LAB — CREATININE, SERUM
Creatinine, Ser: 0.53 mg/dL (ref 0.44–1.00)
GFR, Estimated: >60 mL/min (ref >60)

## 2022-03-29 MED ORDER — METHYLPREDNISOLONE SODIUM SUCC 40 MG IJ SOLR
40.0000 mg | Freq: Two times a day (BID) | INTRAMUSCULAR | Status: AC
  Administered 2022-03-29 (×2): 40 mg via INTRAVENOUS
  Filled 2022-03-29 (×2): qty 1

## 2022-03-29 MED ORDER — NORETHINDRONE ACET-ETHINYL EST 1-20 MG-MCG PO TABS: 1.0000 | ORAL_TABLET | Freq: Every day | ORAL | Status: DC

## 2022-03-29 MED ORDER — KCL IN DEXTROSE-NACL 20-5-0.9 MEQ/L-%-% IV SOLN
INTRAVENOUS | Status: DC
  Filled 2022-03-29 (×3): qty 1000

## 2022-03-29 MED ORDER — SODIUM CHLORIDE 0.9 % IV BOLUS
1000.0000 mL | Freq: Once | INTRAVENOUS | Status: AC
  Administered 2022-03-29: 1000 mL via INTRAVENOUS

## 2022-03-29 MED ORDER — GLYCERIN (LAXATIVE) 2 G RE SUPP
1.0000 | RECTAL | Status: DC | PRN
  Filled 2022-03-29: qty 1

## 2022-03-29 MED ORDER — ONDANSETRON HCL 4 MG/2ML IJ SOLN
4.0000 mg | Freq: Once | INTRAMUSCULAR | Status: AC
  Administered 2022-03-29: 4 mg via INTRAVENOUS
  Filled 2022-03-29: qty 2

## 2022-03-29 MED ORDER — HYDROMORPHONE HCL 1 MG/ML IJ SOLN: 0.5000 mg | INTRAMUSCULAR | Status: DC | PRN

## 2022-03-29 MED ORDER — SODIUM CHLORIDE 0.9 % IV SOLN
1.0000 g | INTRAVENOUS | Status: DC
  Administered 2022-03-29 – 2022-03-30 (×2): 1 g via INTRAVENOUS
  Filled 2022-03-29 (×2): qty 10

## 2022-03-29 MED ORDER — ENOXAPARIN SODIUM 40 MG/0.4ML IJ SOSY
40.0000 mg | PREFILLED_SYRINGE | INTRAMUSCULAR | Status: DC
  Administered 2022-03-29: 40 mg via SUBCUTANEOUS
  Filled 2022-03-29 (×2): qty 0.4

## 2022-03-29 MED ORDER — ACETAMINOPHEN 650 MG RE SUPP: 650.0000 mg | Freq: Four times a day (QID) | RECTAL | Status: DC | PRN

## 2022-03-29 MED ORDER — ONDANSETRON HCL 4 MG PO TABS: 4.0000 mg | ORAL_TABLET | Freq: Four times a day (QID) | ORAL | Status: DC | PRN

## 2022-03-29 MED ORDER — LORATADINE 10 MG PO TABS
10.0000 mg | ORAL_TABLET | Freq: Every day | ORAL | Status: DC
  Administered 2022-03-29 – 2022-03-30 (×2): 10 mg via ORAL
  Filled 2022-03-29 (×2): qty 1

## 2022-03-29 MED ORDER — NALOXONE HCL 0.4 MG/ML IJ SOLN: 0.4000 mg | INTRAMUSCULAR | Status: DC | PRN

## 2022-03-29 MED ORDER — BISACODYL 10 MG RE SUPP: 10.0000 mg | Freq: Every day | RECTAL | Status: DC | PRN

## 2022-03-29 MED ORDER — ONDANSETRON HCL 4 MG/2ML IJ SOLN: 4.0000 mg | Freq: Four times a day (QID) | INTRAMUSCULAR | Status: DC | PRN

## 2022-03-29 MED ORDER — IOHEXOL 300 MG/ML  SOLN
100.0000 mL | Freq: Once | INTRAMUSCULAR | Status: AC | PRN
  Administered 2022-03-29: 100 mL via INTRAVENOUS

## 2022-03-29 MED ORDER — ACETAMINOPHEN 325 MG PO TABS
650.0000 mg | ORAL_TABLET | Freq: Four times a day (QID) | ORAL | Status: DC | PRN
  Administered 2022-03-29: 650 mg via ORAL
  Filled 2022-03-29: qty 2

## 2022-03-29 MED ORDER — FAMOTIDINE 20 MG PO TABS
40.0000 mg | ORAL_TABLET | Freq: Two times a day (BID) | ORAL | Status: DC
  Administered 2022-03-29 – 2022-03-30 (×3): 40 mg via ORAL
  Filled 2022-03-29 (×4): qty 2

## 2022-03-29 MED ORDER — SODIUM CHLORIDE (PF) 0.9 % IJ SOLN
INTRAMUSCULAR | Status: AC
  Filled 2022-03-29: qty 50

## 2022-03-29 MED ORDER — HYDROMORPHONE HCL 1 MG/ML IJ SOLN
1.0000 mg | Freq: Once | INTRAMUSCULAR | Status: AC
  Administered 2022-03-29: 1 mg via INTRAVENOUS
  Filled 2022-03-29: qty 1

## 2022-03-29 MED ORDER — NORTRIPTYLINE HCL 25 MG PO CAPS
25.0000 mg | ORAL_CAPSULE | Freq: Every day | ORAL | Status: DC | PRN
  Filled 2022-03-29 (×2): qty 1

## 2022-03-29 NOTE — ED Provider Notes (Signed)
[facility] DEPT Provider Note   CSN: [id number] Arrival date & time: 03/29/22  2620     History  Chief Complaint  Patient presents with   Abdominal Pain    Gail Johnson is a 31 y.o. female.  HPI   Without significant medical history presents with complaints of abdominal pain, states pain is around her umbilicus, has remained constant, does not radiate, associated nausea without vomiting, still passing gas, no bowel movement, last meal was yesterday denies melena or bloody stools, no urinary symptoms no vaginal discharge vaginal bleeding no pelvic pain, no abdominal surgeries, denies excessive alcohol use, NSAID use, not nursing any fevers chills cough congestion no general body aches, she is never had this in the past.    Home Medications Prior to Admission medications   Medication Sig Start Date End Date Taking? Authorizing Provider  EPINEPHrine (EPIPEN 2-PAK) 0.3 mg/0.3 mL IJ SOAJ injection Inject 0.3 mg into the muscle as needed for anaphylaxis. 03/25/22   Larose Kells, MD  famotidine (PEPCID) 40 MG tablet Take 1 tablet (40 mg total) by mouth 2 (two) times daily. 03/25/22   Larose Kells, MD  fexofenadine [facility] ALLERGY) 180 MG tablet Take 2 tablets (360 mg total) by mouth in the morning and at bedtime. 03/25/22   Larose Kells, MD  hydrOXYzine (ATARAX) 10 MG tablet Take 1 tablet (10 mg total) by mouth at bedtime as needed for itching. 03/25/22   Larose Kells, MD  JUNEL 1/20 1-20 MG-MCG tablet Take 1 tablet by mouth daily. 07/20/21   [provider]  nortriptyline (PAMELOR) 25 MG capsule TAKE 1 CAPSULE BY MOUTH EVERYDAY AT BEDTIME 01/25/22   Pieter Partridge, DO      Allergies    Patient has no known allergies.    Review of Systems   Review of Systems  Constitutional:  Negative for chills and fever.  Respiratory:  Negative for shortness of breath.   Cardiovascular:  Negative for chest pain.  Gastrointestinal:  Positive for  abdominal pain and nausea. Negative for constipation, diarrhea and vomiting.  Neurological:  Negative for headaches.    Physical Exam Updated Vital Signs BP 120/72 (BP Location: Right Arm)   Pulse 90   Temp 98.3 F (36.8 C) (Oral)   Resp 17   LMP 02/27/2022 (Approximate) Comment: negative urine pregnancy test 03/29/22  SpO2 100%  Physical Exam Vitals and nursing note reviewed.  Constitutional:      General: She is not in acute distress.    Appearance: She is not ill-appearing.  HENT:     Head: Normocephalic and atraumatic.     Nose: No congestion.  Eyes:     Conjunctiva/sclera: Conjunctivae normal.  Cardiovascular:     Rate and Rhythm: Normal rate and regular rhythm.     Pulses: Normal pulses.     Heart sounds: No murmur heard.    No friction rub. No gallop.  Pulmonary:     Effort: No respiratory distress.     Breath sounds: No wheezing, rhonchi or rales.  Abdominal:     Palpations: Abdomen is soft.     Tenderness: There is abdominal tenderness. There is guarding. There is no right CVA tenderness or left CVA tenderness.     Comments: Abdomen nondistended, soft, she has involuntary guarding, tenderness is diffuse but mainly around the umbilicus, no rebound tenderness, no peritoneal sign, no flank tenderness or CVA tenderness no suprapubic pain.  Musculoskeletal:     Right lower  leg: No edema.     Left lower leg: No edema.  Skin:    General: Skin is warm and dry.  Neurological:     Mental Status: She is alert.  Psychiatric:        Mood and Affect: Mood normal.     ED Results / Procedures / Treatments   Labs (all labs ordered are listed, but only abnormal results are displayed) Labs Reviewed  COMPREHENSIVE METABOLIC PANEL - Abnormal; Notable for the following components:      Result Value   Sodium 134 (*)    Calcium 8.3 (*)    Albumin 3.3 (*)    AST 11 (*)    All other components within normal limits  URINALYSIS, ROUTINE W REFLEX MICROSCOPIC - Abnormal; Notable  for the following components:   APPearance CLOUDY (*)    Hgb urine dipstick SMALL (*)    Leukocytes,Ua LARGE (*)    Bacteria, UA MANY (*)    Squamous Epithelial / LPF >50 (*)    All other components within normal limits  CBC WITH DIFFERENTIAL/PLATELET - Abnormal; Notable for the following components:   WBC 18.5 (*)    Platelets 439 (*)    Neutro Abs 14.4 (*)    Abs Immature Granulocytes 0.09 (*)    All other components within normal limits  LIPASE, BLOOD  PREGNANCY, URINE    EKG None  Radiology CT ABDOMEN PELVIS W CONTRAST  Result Date: 03/29/2022 CLINICAL DATA:  31 year old female with abdominal pain, periumbilical pain since November 10th. Leukocytosis. EXAM: CT ABDOMEN AND PELVIS WITH CONTRAST TECHNIQUE: Multidetector CT imaging of the abdomen and pelvis was performed using the standard protocol following bolus administration of intravenous contrast. RADIATION DOSE REDUCTION: This exam was performed according to the departmental dose-optimization program which includes automated exposure control, adjustment of the mA and/or kV according to patient size and/or use of iterative reconstruction technique. CONTRAST:  145m OMNIPAQUE IOHEXOL 300 MG/ML  SOLN COMPARISON:  CTA chest 01/13/2014. FINDINGS: Lower chest: Negative.  No pericardial or pleural effusion. Hepatobiliary: Tiny but circumscribed low-density benign cyst or hemangioma at the liver dome was evident in 2015 and appears unchanged (no follow-up imaging recommended). Other liver enhancement is normal. Gallbladder and bile ducts appear negative. Pancreas: Negative. Spleen: Negative. Adrenals/Urinary Tract: Negative. Decompressed urinary bladder and occasional pelvic phleboliths. Stomach/Bowel: Moderate retained stool in the rectosigmoid colon. Proximal sigmoid and descending colon are fairly decompressed. Mild to moderate retained stool the splenic flexure. Upstream transverse colon is decompressed which might account for the  appearance of generalized wall thickening there. Similar decompression of the ascending colon which also appears thick-walled on series 2, image 46. Evidence of a decompressed and normal appendix medial to the cecum on coronal image 49. But long segment severely thickened distal small bowel in the right lower abdomen is demonstrated. See series 2, image 65 and coronal image 39). This appears to be the terminal ileum loop, with long segment bulky and circumferential wall thickening, and a gradual transition to more normal appearing upstream small bowel although the upstream loop contains flocculated material on series 4, image 43 suggesting dysmotility. Other upstream small bowel loops are relatively decompressed and within normal limits. Stomach and duodenum appear negative. No pneumoperitoneum. But there is a small volume of free fluid throughout the distal small bowel mesentery. Simple fluid density, although some mild associated peritoneal thickening (coronal image 40). Relatively mild small bowel mesenteric inflammation otherwise. Vascular/Lymphatic: Major arterial structures in the abdomen and pelvis appear patent and  normal. Portal venous system appears patent. No lymphadenopathy identified. Reproductive: Within normal limits; 3.6 cm right ovarian simple-appearing cyst. No follow-up imaging is recommended. Reference: JACR 2020 Feb;17(2):248-254 Other: Trace if any pelvis free fluid. Musculoskeletal: No acute osseous abnormality identified. Some pubic symphysis degeneration. IMPRESSION: 1. Positive for long segment wall thickening and inflammation of the Terminal Ileum. Free fluid in the distal small bowel mesentery with mild mesenteric and peritoneal thickening/inflammation. And possible associated colitis of the right colon and transverse colon. Favor Inflammatory Bowel Disease, especially Crohn's disease, although an infectious enterocolitis is difficult to exclude. Evidence of some upstream small bowel  dysmotility. But no mechanical bowel obstruction. No abscess identified. 2. Appendix remains normal, and No other acute or inflammatory process identified in the abdomen or pelvis. Electronically Signed   By: Genevie Ann M.D.   On: 03/29/2022 05:34    Procedures Procedures    Medications Ordered in ED Medications  sodium chloride 0.9 % bolus 1,000 mL (1,000 mLs Intravenous Bolus 03/29/22 0449)  ondansetron (ZOFRAN) injection 4 mg (4 mg Intravenous Given 03/29/22 0448)  HYDROmorphone (DILAUDID) injection 1 mg (1 mg Intravenous Given 03/29/22 0448)  sodium chloride (PF) 0.9 % injection (  Given by Other 03/29/22 0451)  iohexol (OMNIPAQUE) 300 MG/ML solution 100 mL (100 mLs Intravenous Contrast Given 03/29/22 0456)    ED Course/ Medical Decision Making/ A&P                           Medical Decision Making Amount and/or Complexity of Data Reviewed Labs: ordered. Radiology: ordered.  Risk Prescription drug management. Decision regarding hospitalization.   This patient presents to the ED for concern of abdominal pain, this involves an extensive number of treatment options, and is a complaint that carries with it a high risk of complications and morbidity.  The differential diagnosis includes appendicitis, diverticulitis, volvulus, bowel obstruction,    Additional history obtained:  Additional history obtained from N/A External records from outside source obtained and reviewed including neurology notes, previous ER notes   Co morbidities that complicate the patient evaluation  N/A  Social Determinants of Health:  N/A    Lab Tests:  I Ordered, and personally interpreted labs.  The pertinent results include: CBC shows leukocytosis of 18.5, platelet of 439, CMP shows sodium of 134, calcium 8.3, AST 11, urine pregnancy negative, UA shows large leukocytes red blood cells white blood cells many bacteria squamous cells present   Imaging Studies ordered:  I ordered imaging  studies including CT AP I independently visualized and interpreted imaging which showed thickening inflammatory changes terminal end of the ileum, concern for possible Crohn's disease. I agree with the radiologist interpretation   Cardiac Monitoring:  The patient was maintained on a cardiac monitor.  I personally viewed and interpreted the cardiac monitored which showed an underlying rhythm of: N/A   Medicines ordered and prescription drug management:  I ordered medication including Dilaudid, fluids, antiemetics I have reviewed the patients home medicines and have made adjustments as needed  Critical Interventions:  N/A   Reevaluation:  Presents with abdominal pain triage obtain basic lab work which is significant for leukocytosis, concern for possible appendicitis will obtain CT imaging for further evaluation   Reassessed the patient states she is feeling better but is still in pain, will provide her with additional dose of pain medication, and recommend admission for further evaluation she is agreement this plan.  Consultations Obtained:  I requested consultation with  the hospitalist dr Glo Herring,  and discussed lab and imaging findings as well as pertinent plan - they recommend:  who will admit the patient    Test Considered:  N/A    Rule out low suspicion for lower lobe pneumonia as lung sounds are clear bilaterally, will defer imaging at this time.  I have low suspicion for liver or gallbladder abnormality liver enzymes, alk phos, T bili all within normal limits, no right upper quadrant tenderness.  Low suspicion for pancreatitis as lipase is within normal limits.  Low suspicion for ruptured stomach ulcer as she has no peritoneal sign present on exam.  Suspicion for bowel obstruction, volvulus, diverticulitis, AAA, kidney stone, pyelois low at this time as CT imaging negative these findings.  Suspicion for UTI is also low she is not nursing any urinary symptoms, UA slightly  abnormal but I suspect this is secondary due to contamination as her squamous cells present in the UA.     Dispostion and problem list  After consideration of the diagnostic results and the patients response to treatment, I feel that the patent would benefit from admission.  Abdominal pain-concern for inflammatory disease like Crohn's disease, will withhold antibiotics as well as steroids, continue with fluid and pain medication, patient need formal consultation by GI for further recommendations.             Final Clinical Impression(s) / ED Diagnoses Final diagnoses:  Generalized abdominal pain    Rx / DC Orders ED Discharge Orders     None         Marcello Fennel, PA-C 03/29/22 7530    Orpah Greek, MD 03/29/22 [phone]

## 2022-03-29 NOTE — Consult Note (Signed)
Referring Provider: [facility] Primary Care Physician:  [facility] [place] Primary Gastroenterologist:  Gentry Fitz  Reason for Consultation:  Abdominal pain  HPI: Gail Johnson is a 31 y.o. female She has a past medical history significant of chronic idiopathic urticaria, migraines, anemia during pregnancy presents for evaluation of generalized abdominal pain.  Patient states she began having intermittent severe stabbing abdominal pain that started November 10th. She states it would come on randomly, not associated with eating or bowel movements. When it would start, it would last for a few hours before stopping. Denies melena/hematochezia. Had one episode of nonbloody vomitus on November 10th but no further episodes. States typically she leans more towards constipation. Has history of idiopathic urticaria. She states since paying more attention, a few days before her hives come on she will had reflux and some abdominal pain leading up to it. Denies weight loss. Denies family history of colon cancer or IBD. States her grandmother used to get fluid taken out of her belly.  States she rarely drinks alcohol but when she does she becomes extremely sick. Denies NSAID use. No previous history of endoscopic procedures.   Past Medical History:  Diagnosis Date   Anemia    Anxiety    BV (bacterial vaginosis) 07/04/2014   Chlamydia    HSV-2 infection    Infection    Multiple allergies    gts hives   Urticaria    Vaginal discharge 07/04/2014    Past Surgical History:  Procedure Laterality Date   NO PAST SURGERIES      Prior to Admission medications   Medication Sig Start Date End Date Taking? Authorizing Provider  aspirin-acetaminophen-caffeine (EXCEDRIN MIGRAINE) [phone] MG tablet Take 2 tablets by mouth daily as needed for headache or migraine.   Yes [provider]  Calcium Carbonate Antacid (TUMS PO) Take 1 tablet by mouth daily as needed (heartburn).   Yes  [provider]  famotidine (PEPCID) 40 MG tablet Take 1 tablet (40 mg total) by mouth 2 (two) times daily. 03/25/22  Yes Birder Robson, MD  fexofenadine [facility] ALLERGY) 180 MG tablet Take 2 tablets (360 mg total) by mouth in the morning and at bedtime. 03/25/22  Yes Birder Robson, MD  hydrOXYzine (ATARAX) 10 MG tablet Take 1 tablet (10 mg total) by mouth at bedtime as needed for itching. 03/25/22  Yes Birder Robson, MD  JUNEL 1/20 1-20 MG-MCG tablet Take 1 tablet by mouth daily. 07/20/21  Yes [provider]  nortriptyline (PAMELOR) 25 MG capsule TAKE 1 CAPSULE BY MOUTH EVERYDAY AT BEDTIME Patient taking differently: Take 25 mg by mouth daily as needed (migraines). 01/25/22  Yes Jaffe, Adam R, DO  EPINEPHrine (EPIPEN 2-PAK) 0.3 mg/0.3 mL IJ SOAJ injection Inject 0.3 mg into the muscle as needed for anaphylaxis. Patient not taking: Reported on 03/29/2022 03/25/22   Birder Robson, MD  levocetirizine (XYZAL) 5 MG tablet Take 10 mg by mouth daily. Patient not taking: Reported on 03/29/2022    [provider]    Scheduled Meds:  enoxaparin (LOVENOX) injection  40 mg Subcutaneous Q24H   famotidine  40 mg Oral BID   loratadine  10 mg Oral Daily   norethindrone-ethinyl estradiol  1 tablet Oral Daily   Continuous Infusions:  cefTRIAXone (ROCEPHIN)  IV Stopped (03/29/22 0844)   dextrose 5 % and 0.9 % NaCl with KCl 20 mEq/L     PRN Meds:.acetaminophen **OR** acetaminophen, HYDROmorphone (DILAUDID) injection, naLOXone (NARCAN)  injection, nortriptyline,  ondansetron **OR** ondansetron (ZOFRAN) IV  Allergies as of 03/29/2022   (No Known Allergies)    Family History  Problem Relation Age of Onset   Hypertension Maternal Grandmother    Heart disease Maternal Grandfather    Hypertension Paternal Grandmother    Diabetes Paternal Grandmother    Breast cancer Paternal Grandmother     Social History   Socioeconomic History   Marital status: Single    Spouse name:  Not on file   Number of children: Not on file   Years of education: Not on file   Highest education level: Not on file  Occupational History   Not on file  Tobacco Use   Smoking status: Never    Passive exposure: Past   Smokeless tobacco: Never  Vaping Use   Vaping Use: Never used  Substance and Sexual Activity   Alcohol use: Yes    Comment: mixed drink on 01/08/17   Drug use: No   Sexual activity: Yes    Birth control/protection: Condom, Injection  Other Topics Concern   Not on file  Social History Narrative   Not on file   Social Determinants of Health   Financial Resource Strain: Not on file  Food Insecurity: Not on file  Transportation Needs: Not on file  Physical Activity: Not on file  Stress: Not on file  Social Connections: Not on file  Intimate Partner Violence: Not on file    Review of Systems: Review of Systems  Constitutional:  Negative for chills, fever and weight loss.  HENT:  Negative for hearing loss and tinnitus.   Eyes:  Negative for blurred vision, double vision and photophobia.  Gastrointestinal:  Positive for abdominal pain and constipation. Negative for blood in stool, diarrhea, heartburn, melena, nausea and vomiting.  Genitourinary:  Negative for dysuria and urgency.  Musculoskeletal:  Negative for myalgias and neck pain.  Skin:  Negative for itching and rash.  Neurological:  Negative for seizures and loss of consciousness.  Psychiatric/Behavioral:  Negative for depression and suicidal ideas.      Physical Exam:Physical Exam Constitutional:      Appearance: She is well-developed.  HENT:     Head: Normocephalic and atraumatic.     Nose: Nose normal. No congestion.     Mouth/Throat:     Mouth: Mucous membranes are moist.     Pharynx: Oropharynx is clear.  Eyes:     Extraocular Movements: Extraocular movements intact.     Conjunctiva/sclera: Conjunctivae normal.  Cardiovascular:     Rate and Rhythm: Normal rate and regular rhythm.   Pulmonary:     Effort: Pulmonary effort is normal. No respiratory distress.  Abdominal:     General: Abdomen is flat. Bowel sounds are normal. There is no distension.     Palpations: Abdomen is soft. There is no mass.     Tenderness: There is abdominal tenderness. There is no guarding or rebound.     Hernia: No hernia is present.  Musculoskeletal:        General: No swelling. Normal range of motion.     Cervical back: Normal range of motion and neck supple.  Skin:    General: Skin is warm and dry.  Neurological:     General: No focal deficit present.     Mental Status: She is alert and oriented to person, place, and time.  Psychiatric:        Mood and Affect: Mood normal.        Behavior: Behavior normal.  Thought Content: Thought content normal.        Judgment: Judgment normal.     Vital signs: Vitals:   03/29/22 0349 03/29/22 0750  BP:  (!) 115/59  Pulse:  87  Resp:  18  Temp: 98.3 F (36.8 C) 98.4 F (36.9 C)  SpO2:  100%        GI:  Lab Results: Recent Labs    03/29/22 0355  WBC 18.5*  HGB 12.0  HCT 36.6  PLT 439*   BMET Recent Labs    03/29/22 0355  NA 134*  K 3.6  CL 102  CO2 23  GLUCOSE 93  BUN 8  CREATININE 0.60  CALCIUM 8.3*   LFT Recent Labs    03/29/22 0355  PROT 7.0  ALBUMIN 3.3*  AST 11*  ALT 16  ALKPHOS 47  BILITOT 0.3   PT/INR No results for input(s): "LABPROT", "INR" in the last 72 hours.   Studies/Results: CT ABDOMEN PELVIS W CONTRAST  Result Date: 03/29/2022 CLINICAL DATA:  31 year old female with abdominal pain, periumbilical pain since November 10th. Leukocytosis. EXAM: CT ABDOMEN AND PELVIS WITH CONTRAST TECHNIQUE: Multidetector CT imaging of the abdomen and pelvis was performed using the standard protocol following bolus administration of intravenous contrast. RADIATION DOSE REDUCTION: This exam was performed according to the departmental dose-optimization program which includes automated exposure control,  adjustment of the mA and/or kV according to patient size and/or use of iterative reconstruction technique. CONTRAST:  OMNIPAQUE IOHEXOL 300 MG/ML  SOLN COMPARISON:  CTA chest 01/13/2014. FINDINGS: Lower chest: Negative.  No pericardial or pleural effusion. Hepatobiliary: Tiny but circumscribed low-density benign cyst or hemangioma at the liver dome was evident in 2015 and appears unchanged (no follow-up imaging recommended). Other liver enhancement is normal. Gallbladder and bile ducts appear negative. Pancreas: Negative. Spleen: Negative. Adrenals/Urinary Tract: Negative. Decompressed urinary bladder and occasional pelvic phleboliths. Stomach/Bowel: Moderate retained stool in the rectosigmoid colon. Proximal sigmoid and descending colon are fairly decompressed. Mild to moderate retained stool the splenic flexure. Upstream transverse colon is decompressed which might account for the appearance of generalized wall thickening there. Similar decompression of the ascending colon which also appears thick-walled on series 2, image 46. Evidence of a decompressed and normal appendix medial to the cecum on coronal image 49. But long segment severely thickened distal small bowel in the right lower abdomen is demonstrated. See series 2, image 65 and coronal image 39). This appears to be the terminal ileum loop, with long segment bulky and circumferential wall thickening, and a gradual transition to more normal appearing upstream small bowel although the upstream loop contains flocculated material on series 4, image 43 suggesting dysmotility. Other upstream small bowel loops are relatively decompressed and within normal limits. Stomach and duodenum appear negative. No pneumoperitoneum. But there is a small volume of free fluid throughout the distal small bowel mesentery. Simple fluid density, although some mild associated peritoneal thickening (coronal image 40). Relatively mild small bowel mesenteric inflammation  otherwise. Vascular/Lymphatic: Major arterial structures in the abdomen and pelvis appear patent and normal. Portal venous system appears patent. No lymphadenopathy identified. Reproductive: Within normal limits; 3.6 cm right ovarian simple-appearing cyst. No follow-up imaging is recommended. Reference: JACR 2020 Feb;17(2):248-254 Other: Trace if any pelvis free fluid. Musculoskeletal: No acute osseous abnormality identified. Some pubic symphysis degeneration. IMPRESSION: 1. Positive for long segment wall thickening and inflammation of the Terminal Ileum. Free fluid in the distal small bowel mesentery with mild mesenteric and peritoneal thickening/inflammation. And possible associated  colitis of the right colon and transverse colon. Favor Inflammatory Bowel Disease, especially Crohn's disease, although an infectious enterocolitis is difficult to exclude. Evidence of some upstream small bowel dysmotility. But no mechanical bowel obstruction. No abscess identified. 2. Appendix remains normal, and No other acute or inflammatory process identified in the abdomen or pelvis. Electronically Signed   By: Odessa Fleming M.D.   On: 03/29/2022 05:34    Impression: Abdominal pain - CT of the abdomen and pelvis shows a long segment of wall thickening and inflammation of the terminal ileum as well as free fluid in the distal small bowel mesentery with mild mesenteric and peritoneal thickening/inflammation there also may be associated colitis of the right colon and transverse: Radiologist has interpreted this as IBD consistent with Crohn's although infectious enterocolitis was unable to be excluded.  Appendix was normal.  - WBC 18.5 - BUN and Cr normal - normal LFTs - Lipase 34  Plan: CT shows possible crohn's. Less likely infectious etiology with no diarrhea Will start on steroids and discharge with outpatient follow up and outpatient colonoscopy [facility] will follow    LOS: 0 days   Contina Strain Leanna Sato   PA-C 03/29/2022, 9:05 AM  Contact #  [phone]

## 2022-03-29 NOTE — Progress Notes (Signed)
  Carryover admission to the Day Admitter.  I discussed this case with the EDP, Berle Mull, PA.  Per these discussions:   This is a 32 year old female with no known history of inflammatory bowel disease, who is being admitted for further evaluation/management latus, inflammatory versus infectious, after presenting with several weeks of new onset abdominal discomfort with CT abdomen/pelvis showing evidence of inflammation ileus as well as information associated with the ascending transverse colon which, per radiology is concerning for pulmonary versus infectious, particular Crohn's given suspicion for skip lesion.  Of note, she is not having any acute urinary symptoms, Alysis was contaminated.  Therefore, she was not felt to have uti.  Case has not been d/w GI.  I have placed an order for MedSurg for evaluation management of the above.  I have placed some additional preliminary admit orders via the adult multi-morbid admission order set. I have also ordered prn IV Dilaudid, prn IV Zofran.  I will defer this to her    Newton Pigg, DO Hospitalist

## 2022-03-29 NOTE — ED Triage Notes (Signed)
Periumbilical abdominal pain since 11/10. Thought it was gas but pain has intensified.

## 2022-03-29 NOTE — H&P (Signed)
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  She has a past medical history significant of chronic idiopathic urticaria, migraines, anemia during pregnancy who presented with generalized abdominal pain that began on November 10.  Patient does not have a history of irritable bowel disease.  She states she started with some nausea and several episodes of emesis.  She developed generalized abdominal pain that was not associated with diarrhea or bleeding.  She has not had any arthralgias.  She has not had any fevers.  She does report refluxing gas and bloating symptoms which are similar to issues she has with her chronic idiopathic urticaria.  She recent had a flare of that last week and just finished some prednisone.  She has not had any UTI symptoms although it is difficult for her to tell given the diffuse pain.  She is experiencing focal left lower quadrant pain as well.  In the ER was afebrile and hemodynamically stable.  She had a leukocytosis of 18,500 with elevated immature neutrophils and granulocytes.  Pregnancy test was negative and she states her last menstrual period was October 27.  Her urinalysis was abnormal the appearance, small hemoglobin, large leukocytes, many bacteria, greater than 50 squamous epithelials and 21-50 WBCs.  CT of the abdomen and pelvis started a long segment of wall thickening and inflammation of the terminal ileum as well as free fluid in the distal small bowel mesentery with mild mesenteric and peritoneal thickening/inflammation there also may be associated colitis of the right colon and transverse: Radiologist has interpreted this as IBD  consistent with Crohn's although infectious enterocolitis was unable to be excluded.  Appendix was normal.  Plan is to admit the patient as an inpatient to medical surgical floor consult gastroenterology for further input to assist with management.  Review of Systems: As mentioned in the history of present illness. All other systems reviewed and are negative. Past Medical History:  Diagnosis Date   Anemia    Anxiety    BV (bacterial vaginosis) 07/04/2014   Chlamydia    HSV-2 infection    Infection    Multiple allergies    gts hives   Urticaria    Vaginal discharge 07/04/2014   Past Surgical History:  Procedure Laterality Date   NO PAST SURGERIES     Social History:  reports that she has never smoked. She has been exposed to tobacco smoke. She has never used smokeless tobacco. She reports current alcohol use. She reports that she does not use drugs.  No Known Allergies  Family History  Problem Relation Age of Onset   Hypertension Maternal Grandmother    Heart disease Maternal Grandfather    Hypertension Paternal Grandmother    Diabetes Paternal Grandmother    Breast cancer Paternal Grandmother     Prior to Admission medications   Medication Sig Start Date End Date Taking? Authorizing Provider  aspirin-acetaminophen-caffeine (EXCEDRIN MIGRAINE) [id number] MG tablet Take 2 tablets by mouth daily as needed for headache or migraine.   Yes [provider]  Calcium Carbonate Antacid (TUMS PO) Take 1 tablet by mouth daily as needed (heartburn).   Yes [provider]  famotidine (PEPCID)  40 MG tablet Take 1 tablet (40 mg total) by mouth 2 (two) times daily. 03/25/22  Yes Birder Robson, MD  fexofenadine [facility] ALLERGY) 180 MG tablet Take 2 tablets (360 mg total) by mouth in the morning and at bedtime. 03/25/22  Yes Birder Robson, MD  hydrOXYzine (ATARAX) 10 MG tablet Take 1 tablet (10 mg total) by mouth at bedtime as needed for itching. 03/25/22  Yes Birder Robson,  MD  JUNEL 1/20 1-20 MG-MCG tablet Take 1 tablet by mouth daily. 07/20/21  Yes [provider]  nortriptyline (PAMELOR) 25 MG capsule TAKE 1 CAPSULE BY MOUTH EVERYDAY AT BEDTIME Patient taking differently: Take 25 mg by mouth daily as needed (migraines). 01/25/22  Yes Jaffe, Adam R, DO  EPINEPHrine (EPIPEN 2-PAK) 0.3 mg/0.3 mL IJ SOAJ injection Inject 0.3 mg into the muscle as needed for anaphylaxis. Patient not taking: Reported on 03/29/2022 03/25/22   Birder Robson, MD  levocetirizine (XYZAL) 5 MG tablet Take 10 mg by mouth daily. Patient not taking: Reported on 03/29/2022    [provider]    Physical Exam: Vitals:   03/29/22 0345 03/29/22 0349 03/29/22 0750  BP: 120/72  (!) 115/59  Pulse: 90  87  Resp: 17  18  Temp:  98.3 F (36.8 C) 98.4 F (36.9 C)  TempSrc:  Oral Oral  SpO2: 100%  100%   Constitutional: NAD, calm, comfortable Respiratory: clear to auscultation bilaterally, no wheezing, no crackles. Normal respiratory effort. No accessory muscle use.  Cardiovascular: Regular rate and rhythm, no murmurs / rubs / gallops. No extremity edema. 2+ pedal pulses. No carotid bruits.  Abdomen: Diffusely tender with minimal guarding and no rebounding.  Somewhat more tender in the left lower quadrant.  No masses palpated. No hepatosplenomegaly. Bowel sounds positive.  Musculoskeletal: no clubbing / cyanosis. No joint deformity upper and lower extremities. Good ROM, no contractures. Normal muscle tone.  Skin: no rashes, lesions, ulcers. No induration Neurologic: CN 2-12 grossly intact. Sensation intact, DTR normal. Strength 5/5 x all 4 extremities.  Psychiatric: Normal judgment and insight. Alert and oriented x 3. Normal mood.   Data Reviewed:  Laboratory and diagnostic data: As above per HPI.  Patient's sodium was 134, albumin 3.3, platelets 439,000, hemoglobin 12, lipase normal, urine culture has been ordered   Assessment and Plan: Acute abdominal pain secondary to  small bowel enteritis suspicious for new onset Crohn's disease N.p.o. until seen by GI-consultation pending If GI determines symptoms related to Crohn's disease then would need to initiate IV steroids otherwise if infectious in etiology will need to add Flagyl to current Rocephin being utilized to treat abnormal urinalysis IV Dilaudid for pain-she reports that this did make her dizzy and of continues to have intolerance will need to change to IV morphine  History of chronic idiopathic urticaria Currently asymptomatic Recent course of prednisone Continue Pepcid  Abnormal urinalysis Follow-up on urine culture Begin Rocephin  History of migraines Continue as needed Pamelor    Advance Care Planning:   Code Status: Full Code   VTE prophylaxis: Lovenox  Consults: [facility]  Family Communication: Patient only  Severity of Illness: The appropriate patient status for this patient is INPATIENT. Inpatient status is judged to be reasonable and necessary in order to provide the required intensity of service to ensure the patient's safety. The patient's presenting symptoms, physical exam findings, and initial radiographic and laboratory data in the context of their chronic comorbidities is felt to place them at high risk  for further clinical deterioration. Furthermore, it is not anticipated that the patient will be medically stable for discharge from the hospital within 2 midnights of admission.   * I certify that at the point of admission it is my clinical judgment that the patient will require inpatient hospital care spanning beyond 2 midnights from the point of admission due to high intensity of service, high risk for further deterioration and high frequency of surveillance required.*  Author: Junious Silk, NP 03/29/2022 8:25 AM  For on call review [email or web address].

## 2022-03-30 DIAGNOSIS — K529 Noninfective gastroenteritis and colitis, unspecified: Principal | ICD-10-CM

## 2022-03-30 LAB — COMPREHENSIVE METABOLIC PANEL
ALT: 14 U/L (ref 0–44)
AST: 10 U/L — ABNORMAL LOW (ref 15–41)
Albumin: 3 g/dL — ABNORMAL LOW (ref 3.5–5.0)
Alkaline Phosphatase: 47 U/L (ref 38–126)
Anion gap: 5 (ref 5–15)
BUN: <5 mg/dL — ABNORMAL LOW (ref 6–20)
CO2: 24 mmol/L (ref 22–32)
Calcium: 8.4 mg/dL — ABNORMAL LOW (ref 8.9–10.3)
Chloride: 114 mmol/L — ABNORMAL HIGH (ref 98–111)
Creatinine, Ser: 0.43 mg/dL — ABNORMAL LOW (ref 0.44–1.00)
GFR, Estimated: >60 mL/min (ref >60)
Glucose, Bld: 159 mg/dL — ABNORMAL HIGH (ref 70–99)
Potassium: 3.9 mmol/L (ref 3.5–5.1)
Sodium: 143 mmol/L (ref 135–145)
Total Bilirubin: 0.3 mg/dL (ref 0.3–1.2)
Total Protein: 6.8 g/dL (ref 6.5–8.1)

## 2022-03-30 LAB — CBC
HCT: 34.1 % — ABNORMAL LOW (ref 36.0–46.0)
Hemoglobin: 10.9 g/dL — ABNORMAL LOW (ref 12.0–15.0)
MCH: 26.8 pg (ref 26.0–34.0)
MCHC: 32 g/dL (ref 30.0–36.0)
MCV: 84 fL (ref 80.0–100.0)
Platelets: 392 10*3/uL (ref 150–400)
RBC: 4.06 MIL/uL (ref 3.87–5.11)
RDW: 13.9 % (ref 11.5–15.5)
WBC: 20.5 10*3/uL — ABNORMAL HIGH (ref 4.0–10.5)
nRBC: 0 % (ref 0.0–0.2)

## 2022-03-30 LAB — URINE CULTURE: Culture: NO GROWTH

## 2022-03-30 MED ORDER — METHYLPREDNISOLONE SODIUM SUCC 40 MG IJ SOLR
40.0000 mg | Freq: Every day | INTRAMUSCULAR | Status: DC
  Administered 2022-03-30: 40 mg via INTRAVENOUS
  Filled 2022-03-30: qty 1

## 2022-03-30 MED ORDER — PREDNISONE 20 MG PO TABS: 40.0000 mg | ORAL_TABLET | Freq: Every day | ORAL | 1 refills | Status: AC

## 2022-03-30 NOTE — Progress Notes (Signed)
  Transition of Care [facility]) Screening Note   Patient Details  Name: REAGEN GOATES Date of Birth: [birth year]   Transition of Care [facility]) CM/SW Contact:    Otelia Santee, LCSW Phone Number: 03/30/2022, 9:26 AM    Transition of Care Department [facility]) has reviewed patient and no TOC needs have been identified at this time. We will continue to monitor patient advancement through interdisciplinary progression rounds. If new patient transition needs arise, please place a TOC consult.

## 2022-03-30 NOTE — Discharge Summary (Signed)
Physician Discharge Summary  Gail Johnson HLK:[id number] DOB: 1990/07/12 DOA: 03/29/2022  PCP: [facility] [place]  Admit date: 03/29/2022 Discharge date: 03/30/2022 Admitted From: Home Disposition: Home Recommendations for Outpatient Follow-up:  Follow up with PCP in 1 week GI to arrange outpatient follow-up Check BMP and CBC in 1 to 2 weeks Please follow up on the following pending results: None  Home Health: Not indicated Equipment/Devices: Not indicated   Discharge Condition: Stable CODE STATUS: Full code  Follow-up Information     [facility] [place]. Schedule an appointment as soon as possible for a visit in 1 week(s).   Specialty: Family Medicine Contact information: [address] [place] [address] [phone]                 Hospital course 31 year old F with with no significant past medical history presenting with generalized abdominal pain and bloating.  CT abdomen and pelvis showed showed thickening and inflammation of terminal ileum, right colon and transverse colon raising concern for chron's disease.  GI consulted.  She was started on IV Solu-Medrol with significant improvement in her symptoms.  She tolerated soft diet.  She was cleared for discharge on p.o. prednisone 40 mg daily until outpatient follow-up with [facility], who will arrange outpatient follow-up.  Patient may need PJP prophylaxis after 3 weeks if she remains on high-dose steroid.  See individual problem list below for more.   Problems addressed during this hospitalization Principal Problem:   Enteritis of small bowel Active Problems:   Chronic idiopathic urticaria   Abnormal urinalysis   Migraines      Vital signs Vitals:   03/29/22 1318 03/29/22 1710 03/29/22 2225 03/30/22 0505  BP: 106/75 103/66 121/76 105/65  Pulse: 83 68 86 81  Temp: 98.2 F (36.8 C) 98.1 F (36.7 C) 98.4 F (36.9 C) 98.5 F (36.9 C)  Resp: 19 19  16 18   Height:      Weight:      SpO2: 100% 100% 100% 100%  TempSrc: Oral Oral  Oral  BMI (Calculated):         Discharge exam  GENERAL: No apparent distress.  Nontoxic. HEENT: MMM.  Vision and hearing grossly intact.  NECK: Supple.  No apparent JVD.  RESP:  No IWOB.  Fair aeration bilaterally. CVS:  RRR. Heart sounds normal.  ABD/GI/GU: BS+. Abd soft, NTND.  MSK/EXT:  Moves extremities. No apparent deformity. No edema.  SKIN: no apparent skin lesion or wound NEURO: Awake and alert. Oriented appropriately.  No apparent focal neuro deficit. PSYCH: Calm. Normal affect.   Discharge Instructions Discharge Instructions     Call MD for:  severe uncontrolled pain   Complete by: As directed    Diet general   Complete by: As directed    Continue soft diet for the next 3 to 4 days.   Discharge instructions   Complete by: As directed    It has been a pleasure taking care of you!  You were hospitalized due to chron's disease for which you have been started on steroid.  Your symptoms improved.  We are discharging you on prednisone (steroid) until you follow-up with your gastroenterologist.   Take care,   Increase activity slowly   Complete by: As directed       Allergies as of 03/30/2022   No Known Allergies      Medication List     STOP taking these medications    levocetirizine  5 MG tablet Commonly known as: XYZAL       TAKE these medications    aspirin-acetaminophen-caffeine 250-250-65 MG tablet Commonly known as: EXCEDRIN MIGRAINE Take 2 tablets by mouth daily as needed for headache or migraine.   EPINEPHrine 0.3 mg/0.3 mL Soaj injection Commonly known as: EpiPen 2-Pak Inject 0.3 mg into the muscle as needed for anaphylaxis.   famotidine 40 MG tablet Commonly known as: Pepcid Take 1 tablet (40 mg total) by mouth 2 (two) times daily.   fexofenadine 180 MG tablet Commonly known as: Allegra Allergy Take 2 tablets (360 mg total) by mouth in the morning and  at bedtime.   hydrOXYzine 10 MG tablet Commonly known as: ATARAX Take 1 tablet (10 mg total) by mouth at bedtime as needed for itching.   Junel 1/20 1-20 MG-MCG tablet Generic drug: norethindrone-ethinyl estradiol Take 1 tablet by mouth daily.   nortriptyline 25 MG capsule Commonly known as: PAMELOR TAKE 1 CAPSULE BY MOUTH EVERYDAY AT BEDTIME What changed: See the new instructions.   predniSONE 20 MG tablet Commonly known as: DELTASONE Take 2 tablets (40 mg total) by mouth daily with breakfast. Start taking on: March 31, 2022   TUMS PO Take 1 tablet by mouth daily as needed (heartburn).        Consultations: Gastroenterology  Procedures/Studies: None   CT ABDOMEN PELVIS W CONTRAST  Result Date: 03/29/2022 CLINICAL DATA:  31 year old female with abdominal pain, periumbilical pain since November 10th. Leukocytosis. EXAM: CT ABDOMEN AND PELVIS WITH CONTRAST TECHNIQUE: Multidetector CT imaging of the abdomen and pelvis was performed using the standard protocol following bolus administration of intravenous contrast. RADIATION DOSE REDUCTION: This exam was performed according to the departmental dose-optimization program which includes automated exposure control, adjustment of the mA and/or kV according to patient size and/or use of iterative reconstruction technique. CONTRAST:  100mL OMNIPAQUE IOHEXOL 300 MG/ML  SOLN COMPARISON:  CTA chest 01/13/2014. FINDINGS: Lower chest: Negative.  No pericardial or pleural effusion. Hepatobiliary: Tiny but circumscribed low-density benign cyst or hemangioma at the liver dome was evident in 2015 and appears unchanged (no follow-up imaging recommended). Other liver enhancement is normal. Gallbladder and bile ducts appear negative. Pancreas: Negative. Spleen: Negative. Adrenals/Urinary Tract: Negative. Decompressed urinary bladder and occasional pelvic phleboliths. Stomach/Bowel: Moderate retained stool in the rectosigmoid colon. Proximal sigmoid  and descending colon are fairly decompressed. Mild to moderate retained stool the splenic flexure. Upstream transverse colon is decompressed which might account for the appearance of generalized wall thickening there. Similar decompression of the ascending colon which also appears thick-walled on series 2, image 46. Evidence of a decompressed and normal appendix medial to the cecum on coronal image 49. But long segment severely thickened distal small bowel in the right lower abdomen is demonstrated. See series 2, image 65 and coronal image 39). This appears to be the terminal ileum loop, with long segment bulky and circumferential wall thickening, and a gradual transition to more normal appearing upstream small bowel although the upstream loop contains flocculated material on series 4, image 43 suggesting dysmotility. Other upstream small bowel loops are relatively decompressed and within normal limits. Stomach and duodenum appear negative. No pneumoperitoneum. But there is a small volume of free fluid throughout the distal small bowel mesentery. Simple fluid density, although some mild associated peritoneal thickening (coronal image 40). Relatively mild small bowel mesenteric inflammation otherwise. Vascular/Lymphatic: Major arterial structures in the abdomen and pelvis appear patent and normal. Portal venous system appears patent. No lymphadenopathy identified. Reproductive: Within  normal limits; 3.6 cm right ovarian simple-appearing cyst. No follow-up imaging is recommended. Reference: JACR 2020 Feb;17(2):248-254 Other: Trace if any pelvis free fluid. Musculoskeletal: No acute osseous abnormality identified. Some pubic symphysis degeneration. IMPRESSION: 1. Positive for long segment wall thickening and inflammation of the Terminal Ileum. Free fluid in the distal small bowel mesentery with mild mesenteric and peritoneal thickening/inflammation. And possible associated colitis of the right colon and transverse  colon. Favor Inflammatory Bowel Disease, especially Crohn's disease, although an infectious enterocolitis is difficult to exclude. Evidence of some upstream small bowel dysmotility. But no mechanical bowel obstruction. No abscess identified. 2. Appendix remains normal, and No other acute or inflammatory process identified in the abdomen or pelvis. Electronically Signed   By: Odessa Fleming M.D.   On: 03/29/2022 05:34       The results of significant diagnostics from this hospitalization (including imaging, microbiology, ancillary and laboratory) are listed below for reference.     Microbiology: Recent Results (from the past 240 hour(s))  Urine Culture     Status: None   Collection Time: 03/29/22  3:50 AM   Specimen: Urine, Clean Catch  Result Value Ref Range Status   Specimen Description   Final    URINE, CLEAN CATCH Performed at [facility], [address], [place], [place] [address]    Special Requests   Final    NONE Performed at [facility], [address], [place], [place] [address]    Culture   Final    NO GROWTH Performed at [facility], [address], [place], [place] [address]    Report Status 03/30/2022 FINAL  Final     Labs:  CBC: Recent Labs  Lab 03/29/22 0355 03/29/22 1052 03/30/22 0535  WBC 18.5* 17.9* 20.5*  NEUTROABS 14.4*  --   --   HGB 12.0 11.3* 10.9*  HCT 36.6 35.4* 34.1*  MCV 81.9 82.9 84.0  PLT 439* 409* 392   BMP &GFR Recent Labs  Lab 03/29/22 0355 03/29/22 1052 03/30/22 0535  NA 134*  --  143  K 3.6  --  3.9  CL 102  --  114*  CO2 23  --  24  GLUCOSE 93  --  159*  BUN 8  --  <5*  CREATININE 0.60 0.53 0.43*  CALCIUM 8.3*  --  8.4*   Estimated Creatinine Clearance: 95.4 mL/min (A) (by C-G formula based on SCr of 0.43 mg/dL (L)). Liver & Pancreas: Recent Labs  Lab 03/29/22 0355 03/30/22 0535  AST 11* 10*  ALT 16 14  ALKPHOS 47 47  BILITOT 0.3 0.3  PROT 7.0 6.8  ALBUMIN 3.3* 3.0*    Recent Labs  Lab 03/29/22 0355  LIPASE 34   No results for input(s): "AMMONIA" in the last 168 hours. Diabetic: No results for input(s): "HGBA1C" in the last 72 hours. No results for input(s): "GLUCAP" in the last 168 hours. Cardiac Enzymes: No results for input(s): "CKTOTAL", "CKMB", "CKMBINDEX", "TROPONINI" in the last 168 hours. No results for input(s): "PROBNP" in the last 8760 hours. Coagulation Profile: No results for input(s): "INR", "PROTIME" in the last 168 hours. Thyroid Function Tests: No results for input(s): "TSH", "T4TOTAL", "FREET4", "T3FREE", "THYROIDAB" in the last 72 hours. Lipid Profile: No results for input(s): "CHOL", "HDL", "LDLCALC", "TRIG", "CHOLHDL", "LDLDIRECT" in the last 72 hours. Anemia Panel: No results for input(s): "VITAMINB12", "FOLATE", "FERRITIN", "TIBC", "IRON", "RETICCTPCT" in the last 72 hours. Urine analysis:    Component Value Date/Time  COLORURINE YELLOW 03/29/2022 0350   APPEARANCEUR CLOUDY (A) 03/29/2022 0350   APPEARANCEUR Cloudy (A) 06/05/2014 1209   LABSPEC 1.024 03/29/2022 0350   PHURINE 5.0 03/29/2022 0350   GLUCOSEU NEGATIVE 03/29/2022 0350   HGBUR SMALL (A) 03/29/2022 0350   BILIRUBINUR NEGATIVE 03/29/2022 0350   BILIRUBINUR Negative 06/05/2014 1209   KETONESUR NEGATIVE 03/29/2022 0350   PROTEINUR NEGATIVE 03/29/2022 0350   UROBILINOGEN 0.2 04/01/2015 1612   NITRITE NEGATIVE 03/29/2022 0350   LEUKOCYTESUR LARGE (A) 03/29/2022 0350   Sepsis Labs: Invalid input(s): "PROCALCITONIN", "LACTICIDVEN"   SIGNED:  Almon Hercules, MD  [facility] 03/30/2022, 9:10 AM

## 2022-03-30 NOTE — Progress Notes (Signed)
Patient discharged home.  Discharge instructions explained, patient verbalizes understanding 

## 2022-03-30 NOTE — Progress Notes (Signed)
[facility]  Gail Johnson 31 y.o. Aug 29, 1990  CC: Generalized abdominal pain   Subjective: Patient states she is doing much better.  Denies abdominal pain.  Denies nausea/vomiting.  Tolerating diet without difficulty  ROS : Review of Systems  Constitutional:  Negative for chills, fever and weight loss.  Gastrointestinal:  Negative for abdominal pain, blood in stool, constipation, diarrhea, heartburn, melena, nausea and vomiting.      Objective: Vital signs in last 24 hours: Vitals:   03/29/22 2225 03/30/22 0505  BP: 121/76 105/65  Pulse: 86 81  Resp: 16 18  Temp: 98.4 F (36.9 C) 98.5 F (36.9 C)  SpO2: 100% 100%    Physical Exam:  General:  Alert, cooperative, no distress, appears stated age  Head:  Normocephalic, without obvious abnormality, atraumatic  Eyes:  Anicteric sclera, EOM's intact  Lungs:   Clear to auscultation bilaterally, respirations unlabored  Heart:  Regular rate and rhythm, S1, S2 normal  Abdomen:   Soft, non-tender, bowel sounds active all four quadrants,  no masses,   Extremities: Extremities normal, atraumatic, no  edema  Pulses: 2+ and symmetric    Lab Results: Recent Labs    03/29/22 0355 03/29/22 1052 03/30/22 0535  NA 134*  --  143  K 3.6  --  3.9  CL 102  --  114*  CO2 23  --  24  GLUCOSE 93  --  159*  BUN 8  --  <5*  CREATININE 0.60 0.53 0.43*  CALCIUM 8.3*  --  8.4*   Recent Labs    03/29/22 0355 03/30/22 0535  AST 11* 10*  ALT 16 14  ALKPHOS 47 47  BILITOT 0.3 0.3  PROT 7.0 6.8  ALBUMIN 3.3* 3.0*   Recent Labs    03/29/22 0355 03/29/22 1052 03/30/22 0535  WBC 18.5* 17.9* 20.5*  NEUTROABS 14.4*  --   --   HGB 12.0 11.3* 10.9*  HCT 36.6 35.4* 34.1*  MCV 81.9 82.9 84.0  PLT 439* 409* 392   No results for input(s): "LABPROT", "INR" in the last 72 hours.    Assessment Expand All Collapse All  Referring Provider: [facility] Physician:  [facility], [facility] @  [place] Primary Gastroenterologist:  Gentry FitzUnassigned   Reason for Consultation:  Abdominal pain   HPI: Gail HertzJasmine D Manzer is a 31 y.o. female She has a past medical history significant of chronic idiopathic urticaria, migraines, anemia during pregnancy presents for evaluation of generalized abdominal pain.   Patient states she began having intermittent severe stabbing abdominal pain that started November 10th. She states it would come on randomly, not associated with eating or bowel movements. When it would start, it would last for a few hours before stopping. Denies melena/hematochezia. Had one episode of nonbloody vomitus on November 10th but no further episodes. States typically she leans more towards constipation. Has history of idiopathic urticaria. She states since paying more attention, a few days before her hives come on she will had reflux and some abdominal pain leading up to it. Denies weight loss. Denies family history of colon cancer or IBD. States her grandmother used to get fluid taken out of her belly.   States she rarely drinks alcohol but when she does she becomes extremely sick. Denies NSAID use. No previous history of endoscopic procedures.         Past Medical History:  Diagnosis Date   Anemia     Anxiety     BV (bacterial vaginosis)  07/04/2014   Chlamydia     HSV-2 infection     Infection     Multiple allergies      gts hives   Urticaria     Vaginal discharge 07/04/2014           Past Surgical History:  Procedure Laterality Date   NO PAST SURGERIES                 Prior to Admission medications   Medication Sig Start Date End Date Taking? Authorizing Provider  aspirin-acetaminophen-caffeine (EXCEDRIN MIGRAINE) [phone] MG tablet Take 2 tablets by mouth daily as needed for headache or migraine.     Yes [provider]  Calcium Carbonate Antacid (TUMS PO) Take 1 tablet by mouth daily as needed (heartburn).     Yes [provider]  famotidine  (PEPCID) 40 MG tablet Take 1 tablet (40 mg total) by mouth 2 (two) times daily. 03/25/22   Yes Birder Robson, MD  fexofenadine [facility] ALLERGY) 180 MG tablet Take 2 tablets (360 mg total) by mouth in the morning and at bedtime. 03/25/22   Yes Birder Robson, MD  hydrOXYzine (ATARAX) 10 MG tablet Take 1 tablet (10 mg total) by mouth at bedtime as needed for itching. 03/25/22   Yes Birder Robson, MD  JUNEL 1/20 1-20 MG-MCG tablet Take 1 tablet by mouth daily. 07/20/21   Yes [provider]  nortriptyline (PAMELOR) 25 MG capsule TAKE 1 CAPSULE BY MOUTH EVERYDAY AT BEDTIME Patient taking differently: Take 25 mg by mouth daily as needed (migraines). 01/25/22   Yes Jaffe, Adam R, DO  EPINEPHrine (EPIPEN 2-PAK) 0.3 mg/0.3 mL IJ SOAJ injection Inject 0.3 mg into the muscle as needed for anaphylaxis. Patient not taking: Reported on 03/29/2022 03/25/22     Birder Robson, MD  levocetirizine (XYZAL) 5 MG tablet Take 10 mg by mouth daily. Patient not taking: Reported on 03/29/2022       [provider]      Scheduled Meds:  enoxaparin (LOVENOX) injection  40 mg Subcutaneous Q24H   famotidine  40 mg Oral BID   loratadine  10 mg Oral Daily   norethindrone-ethinyl estradiol  1 tablet Oral Daily    Continuous Infusions:  cefTRIAXone (ROCEPHIN)  IV Stopped (03/29/22 0844)   dextrose 5 % and 0.9 % NaCl with KCl 20 mEq/L      PRN Meds:.acetaminophen **OR** acetaminophen, HYDROmorphone (DILAUDID) injection, naLOXone (NARCAN)  injection, nortriptyline, ondansetron **OR** ondansetron (ZOFRAN) IV      Allergies as of 03/29/2022   (No Known Allergies)           Family History  Problem Relation Age of Onset   Hypertension Maternal Grandmother     Heart disease Maternal Grandfather     Hypertension Paternal Grandmother     Diabetes Paternal Grandmother     Breast cancer Paternal Grandmother        Social History         Socioeconomic History   Marital status: Single      Spouse  name: Not on file   Number of children: Not on file   Years of education: Not on file   Highest education level: Not on file  Occupational History   Not on file  Tobacco Use   Smoking status: Never      Passive exposure: Past   Smokeless tobacco: Never  Vaping Use   Vaping Use: Never used  Substance and Sexual Activity  Alcohol use: Yes      Comment: mixed drink on 01/08/17   Drug use: No   Sexual activity: Yes      Birth control/protection: Condom, Injection  Other Topics Concern   Not on file  Social History Narrative   Not on file    Social Determinants of Health    Financial Resource Strain: Not on file  Food Insecurity: Not on file  Transportation Needs: Not on file  Physical Activity: Not on file  Stress: Not on file  Social Connections: Not on file  Intimate Partner Violence: Not on file      Review of Systems: Review of Systems  Constitutional:  Negative for chills, fever and weight loss.  HENT:  Negative for hearing loss and tinnitus.   Eyes:  Negative for blurred vision, double vision and photophobia.  Gastrointestinal:  Positive for abdominal pain and constipation. Negative for blood in stool, diarrhea, heartburn, melena, nausea and vomiting.  Genitourinary:  Negative for dysuria and urgency.  Musculoskeletal:  Negative for myalgias and neck pain.  Skin:  Negative for itching and rash.  Neurological:  Negative for seizures and loss of consciousness.  Psychiatric/Behavioral:  Negative for depression and suicidal ideas.       Physical Exam:Physical Exam Constitutional:      Appearance: She is well-developed.  HENT:     Head: Normocephalic and atraumatic.     Nose: Nose normal. No congestion.     Mouth/Throat:     Mouth: Mucous membranes are moist.     Pharynx: Oropharynx is clear.  Eyes:     Extraocular Movements: Extraocular movements intact.     Conjunctiva/sclera: Conjunctivae normal.  Cardiovascular:     Rate and Rhythm: Normal rate and regular  rhythm.  Pulmonary:     Effort: Pulmonary effort is normal. No respiratory distress.  Abdominal:     General: Abdomen is flat. Bowel sounds are normal. There is no distension.     Palpations: Abdomen is soft. There is no mass.     Tenderness: There is abdominal tenderness. There is no guarding or rebound.     Hernia: No hernia is present.  Musculoskeletal:        General: No swelling. Normal range of motion.     Cervical back: Normal range of motion and neck supple.  Skin:    General: Skin is warm and dry.  Neurological:     General: No focal deficit present.     Mental Status: She is alert and oriented to person, place, and time.  Psychiatric:        Mood and Affect: Mood normal.        Behavior: Behavior normal.        Thought Content: Thought content normal.        Judgment: Judgment normal.      Vital signs:     Vitals:    03/29/22 0349 03/29/22 0750  BP:   (!) 115/59  Pulse:   87  Resp:   18  Temp: 98.3 F (36.8 C) 98.4 F (36.9 C)  SpO2:   100%          GI:  Lab Results: Recent Labs (last 2 labs)     Recent Labs    03/29/22 0355  WBC 18.5*  HGB 12.0  HCT 36.6  PLT 439*      BMET Recent Labs (last 2 labs)     Recent Labs    03/29/22 0355  NA 134*  K  3.6  CL 102  CO2 23  GLUCOSE 93  BUN 8  CREATININE 0.60  CALCIUM 8.3*      LFT Recent Labs (last 2 labs)     Recent Labs    03/29/22 0355  PROT 7.0  ALBUMIN 3.3*  AST 11*  ALT 16  ALKPHOS 47  BILITOT 0.3      PT/INR Recent Labs (last 2 labs)  No results for input(s): "LABPROT", "INR" in the last 72 hours.       Studies/Results:  Imaging Results (Last 48 hours)  CT ABDOMEN PELVIS W CONTRAST   Result Date: 03/29/2022 CLINICAL DATA:  32 year old female with abdominal pain, periumbilical pain since November 10th. Leukocytosis. EXAM: CT ABDOMEN AND PELVIS WITH CONTRAST TECHNIQUE: Multidetector CT imaging of the abdomen and pelvis was performed using the standard protocol  following bolus administration of intravenous contrast. RADIATION DOSE REDUCTION: This exam was performed according to the departmental dose-optimization program which includes automated exposure control, adjustment of the mA and/or kV according to patient size and/or use of iterative reconstruction technique. CONTRAST:  OMNIPAQUE IOHEXOL 300 MG/ML  SOLN COMPARISON:  CTA chest 01/13/2014. FINDINGS: Lower chest: Negative.  No pericardial or pleural effusion. Hepatobiliary: Tiny but circumscribed low-density benign cyst or hemangioma at the liver dome was evident in 2015 and appears unchanged (no follow-up imaging recommended). Other liver enhancement is normal. Gallbladder and bile ducts appear negative. Pancreas: Negative. Spleen: Negative. Adrenals/Urinary Tract: Negative. Decompressed urinary bladder and occasional pelvic phleboliths. Stomach/Bowel: Moderate retained stool in the rectosigmoid colon. Proximal sigmoid and descending colon are fairly decompressed. Mild to moderate retained stool the splenic flexure. Upstream transverse colon is decompressed which might account for the appearance of generalized wall thickening there. Similar decompression of the ascending colon which also appears thick-walled on series 2, image 46. Evidence of a decompressed and normal appendix medial to the cecum on coronal image 49. But long segment severely thickened distal small bowel in the right lower abdomen is demonstrated. See series 2, image 65 and coronal image 39). This appears to be the terminal ileum loop, with long segment bulky and circumferential wall thickening, and a gradual transition to more normal appearing upstream small bowel although the upstream loop contains flocculated material on series 4, image 43 suggesting dysmotility. Other upstream small bowel loops are relatively decompressed and within normal limits. Stomach and duodenum appear negative. No pneumoperitoneum. But there is a small volume of free  fluid throughout the distal small bowel mesentery. Simple fluid density, although some mild associated peritoneal thickening (coronal image 40). Relatively mild small bowel mesenteric inflammation otherwise. Vascular/Lymphatic: Major arterial structures in the abdomen and pelvis appear patent and normal. Portal venous system appears patent. No lymphadenopathy identified. Reproductive: Within normal limits; 3.6 cm right ovarian simple-appearing cyst. No follow-up imaging is recommended. Reference: JACR 2020 Feb;17(2):248-254 Other: Trace if any pelvis free fluid. Musculoskeletal: No acute osseous abnormality identified. Some pubic symphysis degeneration. IMPRESSION: 1. Positive for long segment wall thickening and inflammation of the Terminal Ileum. Free fluid in the distal small bowel mesentery with mild mesenteric and peritoneal thickening/inflammation. And possible associated colitis of the right colon and transverse colon. Favor Inflammatory Bowel Disease, especially Crohn's disease, although an infectious enterocolitis is difficult to exclude. Evidence of some upstream small bowel dysmotility. But no mechanical bowel obstruction. No abscess identified. 2. Appendix remains normal, and No other acute or inflammatory process identified in the abdomen or pelvis. Electronically Signed   By: Odessa Fleming M.D.   On:  03/29/2022 05:34       Impression: Abdominal pain - CT of the abdomen and pelvis shows a long segment of wall thickening and inflammation of the terminal ileum as well as free fluid in the distal small bowel mesentery with mild mesenteric and peritoneal thickening/inflammation there also may be associated colitis of the right colon and transverse: Radiologist has interpreted this as IBD consistent with Crohn's although infectious enterocolitis was unable to be excluded.  Appendix was normal.  - WBC 20.5 - BUN and Cr normal - normal LFTs - Lipase 34         Plan: Improvement in abdominal pain.   Patient can transition to prednisone 40 Mg p.o. daily with taper and follow-up as an outpatient for further management in 4 weeks Patient may be discharged from GI standpoint.  Patient will need outpatient colonoscopy to evaluate for possible Crohn's. [facility] will sign off. Please contact us if we can be of any further assistance during this hospital stay.   Legrand Como PA-C 03/30/2022, 8:50 AM  Contact #  [phone]

## 2022-04-11 ENCOUNTER — Ambulatory Visit: Payer: Medicaid Other

## 2022-04-15 ENCOUNTER — Ambulatory Visit (INDEPENDENT_AMBULATORY_CARE_PROVIDER_SITE_OTHER): Payer: Medicaid Other | Admitting: *Deleted

## 2022-04-15 DIAGNOSIS — L501 Idiopathic urticaria: Secondary | ICD-10-CM | POA: Diagnosis not present

## 2022-04-15 MED ORDER — OMALIZUMAB 150 MG/ML ~~LOC~~ SOSY
300.0000 mg | PREFILLED_SYRINGE | SUBCUTANEOUS | Status: AC
  Administered 2022-04-15 – 2023-12-28 (×7): 300 mg via SUBCUTANEOUS

## 2022-04-15 NOTE — Progress Notes (Signed)
Started Xolair injections today. Received 300mg  and will continue every 4 weeks. All information reviewed, consent signed, has Epipen. No issues after observed wait time.

## 2022-04-16 ENCOUNTER — Other Ambulatory Visit: Payer: Self-pay | Admitting: Internal Medicine

## 2022-05-13 ENCOUNTER — Ambulatory Visit (INDEPENDENT_AMBULATORY_CARE_PROVIDER_SITE_OTHER): Payer: Medicaid Other

## 2022-05-13 DIAGNOSIS — L501 Idiopathic urticaria: Secondary | ICD-10-CM

## 2022-05-20 ENCOUNTER — Ambulatory Visit: Payer: Medicaid Other | Admitting: Internal Medicine

## 2022-06-10 ENCOUNTER — Ambulatory Visit: Payer: Medicaid Other

## 2022-06-17 ENCOUNTER — Ambulatory Visit: Payer: Medicaid Other

## 2022-06-17 ENCOUNTER — Ambulatory Visit: Payer: Medicaid Other | Admitting: Internal Medicine

## 2022-06-21 ENCOUNTER — Ambulatory Visit: Payer: Medicaid Other

## 2022-06-23 ENCOUNTER — Emergency Department (HOSPITAL_COMMUNITY)
Admission: EM | Admit: 2022-06-23 | Discharge: 2022-06-23 | Disposition: A | Discharge status: Home / Self Care | Payer: Worker's Compensation | Attending: Emergency Medicine | Admitting: Emergency Medicine

## 2022-06-23 ENCOUNTER — Other Ambulatory Visit: Payer: Self-pay

## 2022-06-23 DIAGNOSIS — Z7982 Long term (current) use of aspirin: Secondary | ICD-10-CM | POA: Diagnosis not present

## 2022-06-23 DIAGNOSIS — Z7721 Contact with and (suspected) exposure to potentially hazardous body fluids: Secondary | ICD-10-CM | POA: Diagnosis not present

## 2022-06-23 LAB — CBC WITH DIFFERENTIAL/PLATELET
Abs Immature Granulocytes: 0.05 10*3/uL (ref 0.00–0.07)
Basophils Absolute: 0.1 10*3/uL (ref 0.0–0.1)
Basophils Relative: 1 %
Eosinophils Absolute: 0.1 10*3/uL (ref 0.0–0.5)
Eosinophils Relative: 1 %
HCT: 36.1 % (ref 36.0–46.0)
Hemoglobin: 11.6 g/dL — ABNORMAL LOW (ref 12.0–15.0)
Immature Granulocytes: 0 %
Lymphocytes Relative: 26 %
Lymphs Abs: 3.2 10*3/uL (ref 0.7–4.0)
MCH: 27.3 pg (ref 26.0–34.0)
MCHC: 32.1 g/dL (ref 30.0–36.0)
MCV: 84.9 fL (ref 80.0–100.0)
Monocytes Absolute: 0.6 10*3/uL (ref 0.1–1.0)
Monocytes Relative: 5 %
Neutro Abs: 8.3 10*3/uL — ABNORMAL HIGH (ref 1.7–7.7)
Neutrophils Relative %: 67 %
Platelets: 304 10*3/uL (ref 150–400)
RBC: 4.25 MIL/uL (ref 3.87–5.11)
RDW: 13.9 % (ref 11.5–15.5)
WBC: 12.4 10*3/uL — ABNORMAL HIGH (ref 4.0–10.5)
nRBC: 0 % (ref 0.0–0.2)

## 2022-06-23 LAB — COMPREHENSIVE METABOLIC PANEL
ALT: 12 U/L (ref 0–44)
AST: 14 U/L — ABNORMAL LOW (ref 15–41)
Albumin: 3.5 g/dL (ref 3.5–5.0)
Alkaline Phosphatase: 32 U/L — ABNORMAL LOW (ref 38–126)
Anion gap: 8 (ref 5–15)
BUN: 7 mg/dL (ref 6–20)
CO2: 25 mmol/L (ref 22–32)
Calcium: 8.6 mg/dL — ABNORMAL LOW (ref 8.9–10.3)
Chloride: 103 mmol/L (ref 98–111)
Creatinine, Ser: 0.64 mg/dL (ref 0.44–1.00)
GFR, Estimated: >60 mL/min (ref >60)
Glucose, Bld: 100 mg/dL — ABNORMAL HIGH (ref 70–99)
Potassium: 3.4 mmol/L — ABNORMAL LOW (ref 3.5–5.1)
Sodium: 136 mmol/L (ref 135–145)
Total Bilirubin: 0.5 mg/dL (ref 0.3–1.2)
Total Protein: 6.7 g/dL (ref 6.5–8.1)

## 2022-06-23 LAB — I-STAT BETA HCG BLOOD, ED (MC, WL, AP ONLY): I-stat hCG, quantitative: <5 m[IU]/mL (ref <5)

## 2022-06-23 MED ORDER — ELVITEG-COBIC-EMTRICIT-TENOFAF 150-150-200-10 MG PO TABS: 1.0000 | ORAL_TABLET | Freq: Every day | ORAL | 0 refills | Status: AC

## 2022-06-23 MED ORDER — ELVITEG-COBIC-EMTRICIT-TENOFAF 150-150-200-10 MG PO TABS: 1.0000 | ORAL_TABLET | Freq: Every day | ORAL | 0 refills | Status: DC

## 2022-06-23 NOTE — ED Provider Triage Note (Signed)
Emergency Medicine Provider Triage Evaluation Note  Gail Johnson , a 32 y.o. female  was evaluated in triage.  Pt complains of patient was menstruating antibiotics to a patient, had a needlestick index finger.  Unknown if patient has history of HIV hepatitis..  Review of Systems  Per HPI  Physical Exam  BP (!) 145/92   Pulse 82   Temp 98.4 F (36.9 C) (Oral)   Resp 16   SpO2 100%   Breastfeeding No  Gen:   Awake, no distress   Resp:  Normal effort  MSK:   Moves extremities without difficulty  Other:  Normal cap refill, radial pulse 2+, able to flex and extend at the DIP and PIP  Medical Decision Making  Medically screening exam initiated at 9:49 PM.  Appropriate orders placed.  Eliane Decree was informed that the remainder of the evaluation will be completed by another provider, this initial triage assessment does not replace that evaluation, and the importance of remaining in the ED until their evaluation is complete.     Sherrill Raring, PA-C 06/23/22 2150

## 2022-06-23 NOTE — ED Provider Notes (Signed)
[facility] Provider Note   CSN: [id number] Arrival date & time: 06/23/22  2114     History  Chief Complaint  Patient presents with   Body Fluid Exposure         Gail Johnson is a 32 y.o. female.  Patient is presenting for needlestick that occurred earlier today.  She works at [employer], was giving an antibiotic to the patient.  She was trying to re-cap the needle when she stuck her finger.  She immediately noticed blood. Patient denies chest pain, abdominal pain, nausea, vomiting, dyspnea.  Patient reports that patient she was given the antibiotic to was HIV negative in the past.  The history is provided by the patient.       Home Medications Prior to Admission medications   Medication Sig Start Date End Date Taking? Authorizing Provider  aspirin-acetaminophen-caffeine (EXCEDRIN MIGRAINE) [phone] MG tablet Take 2 tablets by mouth daily as needed for headache or migraine.    [provider]  Calcium Carbonate Antacid (TUMS PO) Take 1 tablet by mouth daily as needed (heartburn).    [provider]  elvitegravir-cobicistat-emtricitabine-tenofovir (GENVOYA) 150-150-200-10 MG TABS tablet Take 1 tablet by mouth daily with breakfast. 06/23/22   Erskine Emery, MD  elvitegravir-cobicistat-emtricitabine-tenofovir (GENVOYA) 150-150-200-10 MG TABS tablet Take 1 tablet by mouth daily with breakfast. 06/23/22   Erskine Emery, MD  EPINEPHrine (EPIPEN 2-PAK) 0.3 mg/0.3 mL IJ SOAJ injection Inject 0.3 mg into the muscle as needed for anaphylaxis. Patient not taking: Reported on 03/29/2022 03/25/22   Larose Kells, MD  famotidine (PEPCID) 40 MG tablet Take 1 tablet (40 mg total) by mouth 2 (two) times daily. 03/25/22   Larose Kells, MD  fexofenadine [facility] ALLERGY) 180 MG tablet Take 2 tablets (360 mg total) by mouth in the morning and at bedtime. 03/25/22   Larose Kells, MD  hydrOXYzine (ATARAX) 10 MG tablet TAKE 1  TABLET (10 MG TOTAL) BY MOUTH AT BEDTIME AS NEEDED FOR ITCHING 04/18/22   Larose Kells, MD  JUNEL 1/20 1-20 MG-MCG tablet Take 1 tablet by mouth daily. 07/20/21   [provider]  nortriptyline (PAMELOR) 25 MG capsule TAKE 1 CAPSULE BY MOUTH EVERYDAY AT BEDTIME Patient taking differently: Take 25 mg by mouth daily as needed (migraines). 01/25/22   Pieter Partridge, DO      Allergies    Patient has no known allergies.    Review of Systems   Review of Systems  Constitutional:  Negative for chills and fever.  HENT:  Negative for ear pain and sore throat.   Eyes:  Negative for pain and visual disturbance.  Respiratory:  Negative for cough and shortness of breath.   Cardiovascular:  Negative for chest pain and palpitations.  Gastrointestinal:  Negative for abdominal pain and vomiting.  Genitourinary:  Negative for dysuria and hematuria.  Musculoskeletal:  Negative for arthralgias and back pain.  Skin:  Negative for color change and rash.  Neurological:  Negative for seizures and syncope.  All other systems reviewed and are negative.   Physical Exam Updated Vital Signs BP (!) 145/92   Pulse 82   Temp 98.4 F (36.9 C) (Oral)   Resp 16   SpO2 100%   Breastfeeding No  Physical Exam Vitals reviewed.  Constitutional:      Appearance: Normal appearance.  HENT:     Head: Normocephalic.     Nose: Nose normal.     Mouth/Throat:  Mouth: Mucous membranes are moist.  Eyes:     Conjunctiva/sclera: Conjunctivae normal.  Cardiovascular:     Rate and Rhythm: Normal rate and regular rhythm.  Pulmonary:     Effort: Pulmonary effort is normal.  Musculoskeletal:        General: No swelling.     Cervical back: Normal range of motion.     Comments: Full ROM of finger, no open wounds   Skin:    Capillary Refill: Capillary refill takes less than 2 seconds.  Neurological:     General: No focal deficit present.     Mental Status: She is alert.  Psychiatric:        Mood and Affect:  Mood normal.        Behavior: Behavior normal.     ED Results / Procedures / Treatments   Labs (all labs ordered are listed, but only abnormal results are displayed) Labs Reviewed  HEPATITIS PANEL, ACUTE  CBC WITH DIFFERENTIAL/PLATELET  COMPREHENSIVE METABOLIC PANEL  RAPID HIV SCREEN (HIV 1/2 AB+AG)  HEPATITIS C ANTIBODY  HEPATITIS B SURFACE ANTIGEN  RPR  I-STAT BETA HCG BLOOD, ED ([facility], [facility], [facility] ONLY)    EKG None  Radiology No results found.  Procedures Procedures    Medications Ordered in ED Medications - No data to display  ED Course/ Medical Decision Making/ A&P                             Medical Decision Making Medical Decision Making:   Gail Johnson is a 32 y.o. female who presented to the ED today with needlestick at work detailed above.    Complete initial physical exam performed.    Reviewed and confirmed nursing documentation for past medical history, family history, social history.    Initial Assessment:   With the patient's presentation of needle stick, protocol was followed. Obtained HIV, Hepatitis panel, RPR, I-stat preg, CBC/CMP.    Initial Plan:  Continue with the lab work noted above.  Discussed post exposure prophylaxis with the patient which she would like, placed prescription ordered to pharmacy.   Final Assessment and Plan:   Patient was sent home after prescription was ordered to pharmacy for post exposure ppx.  Lab work was obtained.  All questions were answered.  She has an appt with [facility] tomorrow.   Clinical Impression: Exposure to blood or body fluid  (primary encounter diagnosis)   Discharge   Amount and/or Complexity of Data Reviewed Labs: ordered.  Risk Prescription drug management.          Final Clinical Impression(s) / ED Diagnoses Final diagnoses:  Exposure to blood or body fluid    Rx / DC Orders ED Discharge Orders          Ordered    elvitegravir-cobicistat-emtricitabine-tenofovir (GENVOYA)  150-150-200-10 MG TABS tablet  Daily with breakfast,   Status:  Discontinued        06/23/22 2227    elvitegravir-cobicistat-emtricitabine-tenofovir (GENVOYA) 150-150-200-10 MG TABS tablet  Daily with breakfast,   Status:  Discontinued        06/23/22 2227    elvitegravir-cobicistat-emtricitabine-tenofovir (GENVOYA) 150-150-200-10 MG TABS tablet  Daily with breakfast        06/23/22 2229    elvitegravir-cobicistat-emtricitabine-tenofovir (GENVOYA) 150-150-200-10 MG TABS tablet  Daily with breakfast        06/23/22 2229              Erskine Emery, MD 06/23/22 2238  Drenda Freeze, MD 06/25/22 [phone]

## 2022-06-23 NOTE — Discharge Instructions (Addendum)
We will give you a pre pack to take when you get your medications  Then you will have another prescription to take daily   Please be seen if you experience chest pain, shortness of breath, fever, chills

## 2022-06-23 NOTE — ED Triage Notes (Signed)
Patient arrived stating she stuck her right index finger with a contaminated needle at work.

## 2022-06-24 LAB — RAPID HIV SCREEN (HIV 1/2 AB+AG)
HIV 1/2 Antibodies: NONREACTIVE
HIV-1 P24 Antigen - HIV24: NONREACTIVE

## 2022-06-24 LAB — RPR: RPR Ser Ql: NONREACTIVE

## 2022-06-24 LAB — HEPATITIS PANEL, ACUTE
HCV Ab: NONREACTIVE
Hep A IgM: NONREACTIVE
Hep B C IgM: NONREACTIVE
Hepatitis B Surface Ag: NONREACTIVE

## 2022-06-24 LAB — HEPATITIS B SURFACE ANTIGEN: Hepatitis B Surface Ag: NONREACTIVE

## 2022-06-24 LAB — HEPATITIS C ANTIBODY: HCV Ab: NONREACTIVE

## 2022-06-28 ENCOUNTER — Ambulatory Visit: Payer: Medicaid Other | Admitting: Internal Medicine

## 2022-08-26 ENCOUNTER — Other Ambulatory Visit (HOSPITAL_COMMUNITY): Payer: Self-pay

## 2022-08-26 ENCOUNTER — Other Ambulatory Visit: Payer: Self-pay

## 2022-08-26 ENCOUNTER — Other Ambulatory Visit: Payer: Self-pay | Admitting: *Deleted

## 2022-08-26 MED ORDER — OMALIZUMAB 150 MG/ML ~~LOC~~ SOSY
300.0000 mg | PREFILLED_SYRINGE | SUBCUTANEOUS | 11 refills | Status: AC
  Filled 2022-08-26 (×2): qty 2, 28d supply, fill #0

## 2022-08-26 NOTE — Telephone Encounter (Signed)
Patient advised change from [facility] to [facility] due to pharmacy no longer participating with Ins plan 

## 2022-08-29 ENCOUNTER — Other Ambulatory Visit (HOSPITAL_COMMUNITY): Payer: Self-pay

## 2022-08-29 NOTE — Telephone Encounter (Signed)
NA

## 2022-08-30 ENCOUNTER — Other Ambulatory Visit (HOSPITAL_COMMUNITY): Payer: Self-pay

## 2022-09-02 ENCOUNTER — Other Ambulatory Visit (HOSPITAL_COMMUNITY): Payer: Self-pay

## 2022-09-21 ENCOUNTER — Telehealth: Payer: Self-pay | Admitting: *Deleted

## 2022-09-21 NOTE — Telephone Encounter (Signed)
Patient  called upset that her Xolair not here yet. The issue is [facility] changed their policy and and had issues running the claims then I was not aware at the time that patient had another coverage when trying to changer her over to [place] but ultimately [facility] has to dispence due to coverage. I have sent new Ins and Rx to them 4 weeks ago but unfortunately they are still processing same is the answer I keep getting, I have reached out to my contact again and advised she needs to push this through

## 2022-09-30 NOTE — Telephone Encounter (Signed)
Patient called and she is wondering could she start doing her Xolair injections at home. 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   Please advise.

## 2022-10-04 ENCOUNTER — Ambulatory Visit: Payer: Managed Care, Other (non HMO)

## 2022-10-04 DIAGNOSIS — L501 Idiopathic urticaria: Secondary | ICD-10-CM

## 2022-10-04 NOTE — Telephone Encounter (Signed)
Patient has been informed. Agrees to doing 3 consecutive injections.

## 2022-11-01 ENCOUNTER — Ambulatory Visit: Payer: Managed Care, Other (non HMO)

## 2022-11-12 ENCOUNTER — Emergency Department (HOSPITAL_BASED_OUTPATIENT_CLINIC_OR_DEPARTMENT_OTHER)
Admission: EM | Admit: 2022-11-12 | Discharge: 2022-11-12 | Disposition: A | Discharge status: Home / Self Care | Payer: Managed Care, Other (non HMO) | Attending: Emergency Medicine | Admitting: Emergency Medicine

## 2022-11-12 ENCOUNTER — Encounter (HOSPITAL_BASED_OUTPATIENT_CLINIC_OR_DEPARTMENT_OTHER): Payer: Self-pay

## 2022-11-12 ENCOUNTER — Other Ambulatory Visit: Payer: Self-pay

## 2022-11-12 ENCOUNTER — Emergency Department (HOSPITAL_BASED_OUTPATIENT_CLINIC_OR_DEPARTMENT_OTHER): Payer: Managed Care, Other (non HMO) | Admitting: Radiology

## 2022-11-12 DIAGNOSIS — M25562 Pain in left knee: Secondary | ICD-10-CM | POA: Insufficient documentation

## 2022-11-12 NOTE — ED Triage Notes (Signed)
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 Pt was dx with sprained MCL at PCP.  Naproxen, brace, crutches were given without relief. Pt unable to work, works at [employer] as a Engineer, civil (consulting).

## 2022-11-12 NOTE — Discharge Instructions (Signed)
Contact a health care provider if: Your knee pain continues, changes, or gets worse. You have a fever along with knee pain. Your knee feels warm to the touch or is red. Your knee buckles or locks up. Get help right away if: Your knee swells, and the swelling becomes worse. You cannot move your knee. You have severe pain in your knee that cannot be managed with pain medicine. 

## 2022-11-12 NOTE — ED Provider Notes (Signed)
[place] EMERGENCY DEPARTMENT AT [facility] Provider Note   CSN: [id number] Arrival date & time: 11/12/22  1122     History  Chief Complaint  Patient presents with   Knee Pain    Gail Johnson is a 32 y.o. female who presents emergency department chief complaint of left knee pain.  Patient states that she was riding a mechanical bull 1 week ago.  She was seen at [facility] walk-in clinic states that she was diagnosed with an MCL sprain but has not had any imaging.  She continues to have pain in the knee and states that she is unable to do her job currently.  She was not given referral to orthopedist and has had no improvement.  She denies mechanical symptoms in the knee but states she cannot fully straighten the leg due to pain.  She denies fevers chills or abnormal sensation.   Knee Pain      Home Medications Prior to Admission medications   Medication Sig Start Date End Date Taking? Authorizing Provider  aspirin-acetaminophen-caffeine (EXCEDRIN MIGRAINE) [phone] MG tablet Take 2 tablets by mouth daily as needed for headache or migraine.    [provider]  Calcium Carbonate Antacid (TUMS PO) Take 1 tablet by mouth daily as needed (heartburn).    [provider]  elvitegravir-cobicistat-emtricitabine-tenofovir (GENVOYA) 150-150-200-10 MG TABS tablet Take 1 tablet by mouth daily with breakfast. 06/23/22   Alfredo Martinez, MD  elvitegravir-cobicistat-emtricitabine-tenofovir (GENVOYA) 150-150-200-10 MG TABS tablet Take 1 tablet by mouth daily with breakfast. 06/23/22   Alfredo Martinez, MD  EPINEPHrine (EPIPEN 2-PAK) 0.3 mg/0.3 mL IJ SOAJ injection Inject 0.3 mg into the muscle as needed for anaphylaxis. Patient not taking: Reported on 03/29/2022 03/25/22   Birder Robson, MD  famotidine (PEPCID) 40 MG tablet Take 1 tablet (40 mg total) by mouth 2 (two) times daily. 03/25/22   Birder Robson, MD  fexofenadine [facility] ALLERGY) 180 MG tablet Take 2 tablets (360  mg total) by mouth in the morning and at bedtime. 03/25/22   Birder Robson, MD  hydrOXYzine (ATARAX) 10 MG tablet TAKE 1 TABLET (10 MG TOTAL) BY MOUTH AT BEDTIME AS NEEDED FOR ITCHING 04/18/22   Birder Robson, MD  JUNEL 1/20 1-20 MG-MCG tablet Take 1 tablet by mouth daily. 07/20/21   [provider]  nortriptyline (PAMELOR) 25 MG capsule TAKE 1 CAPSULE BY MOUTH EVERYDAY AT BEDTIME Patient taking differently: Take 25 mg by mouth daily as needed (migraines). 01/25/22   Drema Dallas, DO  omalizumab Geoffry Paradise) 150 MG/ML prefilled syringe Inject 300 mg into the skin every 28 (twenty-eight) days. 08/26/22   Birder Robson, MD      Allergies    Patient has no known allergies.    Review of Systems   Review of Systems  Physical Exam Updated Vital Signs BP 122/87 (BP Location: Right Arm)   Pulse (!) 103   Temp 98.7 F (37.1 C) (Oral)   Resp 17   Ht 5\' 6"  (1.676 m)   Wt 72.6 kg   LMP 10/29/2022   SpO2 100%   BMI 25.82 kg/m  Physical Exam Physical Exam  Nursing note and vitals reviewed. Constitutional: She is oriented to person, place, and time. She appears well-developed and well-nourished. No distress.  HENT:  Head: Normocephalic and atraumatic.  Eyes: Conjunctivae normal and EOM are normal. Pupils are equal, round, and reactive to light. No scleral icterus.  Neck: Normal range of motion.  Cardiovascular: Normal rate, regular rhythm  and normal heart sounds.  Exam reveals no gallop and no friction rub.   No murmur heard. Pulmonary/Chest: Effort normal and breath sounds normal. No respiratory distress.  Abdominal: Soft. Bowel sounds are normal. She exhibits no distension and no mass. There is no tenderness. There is no guarding.  Musculoskeletal: Left knee exam shows no swelling or effusion.  Tenderness along the medial collateral ligament and medial joint line.  Ligaments stable on examination, range of motion is limited due to pain especially with extension. Neurological: She is  alert and oriented to person, place, and time.  Skin: Skin is warm and dry. She is not diaphoretic.   ED Results / Procedures / Treatments   Labs (all labs ordered are listed, but only abnormal results are displayed) Labs Reviewed - No data to display  EKG None  Radiology DG Knee Complete 4 Views Left  Result Date: 11/12/2022 CLINICAL DATA:  Fall EXAM: LEFT KNEE - COMPLETE 4 VIEW COMPARISON:  None Available. FINDINGS: No evidence of fracture, dislocation, or joint effusion. No evidence of arthropathy or other focal bone abnormality. Soft tissues are unremarkable. IMPRESSION: Negative. Electronically Signed   By: Allegra Lai M.D.   On: 11/12/2022 12:39    Procedures Procedures    Medications Ordered in ED Medications - No data to display  ED Course/ Medical Decision Making/ A&P                             Medical Decision Making Amount and/or Complexity of Data Reviewed Radiology: ordered.   Patient with knee injury 1 week ago.  Will give work note for the next 2 days.  Follow-up with orthopedics.  Continue using crutches as needed, anti-inflammatory as needed ice elevate.  I visualized and interpreted an x-ray that showed no acute findings.        Final Clinical Impression(s) / ED Diagnoses Final diagnoses:  None    Rx / DC Orders ED Discharge Orders     None         Arthor Captain, PA-C 11/12/22 1338    Alvira Monday, MD 11/14/22 2105

## 2022-11-15 ENCOUNTER — Ambulatory Visit: Payer: Managed Care, Other (non HMO)

## 2022-11-25 ENCOUNTER — Ambulatory Visit (INDEPENDENT_AMBULATORY_CARE_PROVIDER_SITE_OTHER): Payer: Managed Care, Other (non HMO)

## 2022-11-25 DIAGNOSIS — L501 Idiopathic urticaria: Secondary | ICD-10-CM | POA: Diagnosis not present

## 2022-12-23 ENCOUNTER — Ambulatory Visit: Payer: Managed Care, Other (non HMO)

## 2022-12-30 ENCOUNTER — Ambulatory Visit: Payer: Managed Care, Other (non HMO)

## 2022-12-30 ENCOUNTER — Ambulatory Visit: Payer: Managed Care, Other (non HMO) | Admitting: Family Medicine

## 2022-12-30 NOTE — Progress Notes (Deleted)
[address] [place] [address] Dept: [phone]  FOLLOW UP NOTE  Patient ID: Gail Johnson, female    DOB: May 11, 1990  Age: 32 y.o. MRN: [id number] Date of Office Visit: 12/30/2022  Assessment  Chief Complaint: No chief complaint on file.  HPI Gail Johnson is a 32 year old female who presents to the clinic for follow-up visit.  She was last seen in this clinic on 03/25/2022 by Dr. Allena Katz for evaluation of urticaria, angioedema, and chronic rhinitis.  Her last environmental allergy skin testing was on 03/15/2022 and was negative to the adult environmental panel.   Drug Allergies:  No Known Allergies  Physical Exam: There were no vitals taken for this visit.   Physical Exam  Diagnostics:    Assessment and Plan: No diagnosis found.  No orders of the defined types were placed in this encounter.   There are no Patient Instructions on file for this visit.  No follow-ups on file.    Thank you for the opportunity to care for this patient.  Please do not hesitate to contact me with questions.  Thermon Leyland, FNP [facility]

## 2023-01-27 ENCOUNTER — Other Ambulatory Visit: Payer: Self-pay

## 2023-03-16 ENCOUNTER — Emergency Department (HOSPITAL_COMMUNITY)
Admission: EM | Admit: 2023-03-16 | Discharge: 2023-03-17 | Disposition: A | Discharge status: Home / Self Care | Payer: Managed Care, Other (non HMO) | Attending: Emergency Medicine | Admitting: Emergency Medicine

## 2023-03-16 ENCOUNTER — Emergency Department (HOSPITAL_COMMUNITY): Payer: Managed Care, Other (non HMO)

## 2023-03-16 ENCOUNTER — Encounter (HOSPITAL_COMMUNITY): Payer: Self-pay | Admitting: Emergency Medicine

## 2023-03-16 DIAGNOSIS — M25552 Pain in left hip: Secondary | ICD-10-CM | POA: Insufficient documentation

## 2023-03-16 MED ORDER — ACETAMINOPHEN 325 MG PO TABS
650.0000 mg | ORAL_TABLET | Freq: Once | ORAL | Status: AC
  Administered 2023-03-16: 650 mg via ORAL
  Filled 2023-03-16: qty 2

## 2023-03-16 MED ORDER — METHOCARBAMOL 500 MG PO TABS
500.0000 mg | ORAL_TABLET | Freq: Once | ORAL | Status: AC
  Administered 2023-03-16: 500 mg via ORAL
  Filled 2023-03-16: qty 1

## 2023-03-16 MED ORDER — METHOCARBAMOL 500 MG PO TABS: 500.0000 mg | ORAL_TABLET | Freq: Three times a day (TID) | ORAL | 0 refills | Status: AC | PRN

## 2023-03-16 MED ORDER — KETOROLAC TROMETHAMINE 15 MG/ML IJ SOLN
15.0000 mg | Freq: Once | INTRAMUSCULAR | Status: AC
  Administered 2023-03-16: 15 mg via INTRAMUSCULAR
  Filled 2023-03-16: qty 1

## 2023-03-16 MED ORDER — LIDOCAINE 5 % EX PTCH
1.0000 | MEDICATED_PATCH | CUTANEOUS | Status: DC
  Administered 2023-03-16: 1 via TRANSDERMAL
  Filled 2023-03-16: qty 1

## 2023-03-16 NOTE — ED Triage Notes (Signed)
Pt arriving via POV for left hip pain x24 hrs. Pt states she has been working all day and has not taken anything for the pain prior to arrival.

## 2023-03-16 NOTE — Discharge Instructions (Addendum)
You were seen in the emergency department today for your hip pain.  Your physical exam and vital signs are very reassuring.  The muscles in your buttock are in what is called spasm, meaning they are inappropriately tightened up.  This can be quite painful. You likely also have some inflammation of your sciatic nerve.  To help with your pain you may take Tylenol and / or NSAID medication (such as ibuprofen or naproxen) to help with your pain.  Additionally, you have been prescribed a muscle relaxer called Robaxin to help relieve some of the muscle spasm.  Please be advised that this medication may make you very sleepy, so you should not drive or operate heavy machinery while you are taking it.  You may also utilize topical pain relief such as Biofreeze, IcyHot, or topical lidocaine patches.  I also recommend that you apply heat to the area, such as a hot shower or heating pad, and follow heat application with massage of the muscles that are most tight.  Please return to the emergency department if you develop any numbness/tingling/weakness in your arms or legs, any difficulty urinating, or urinary incontinence chest pain, shortness of breath, abdominal pain, nausea or vomiting that does not stop, or any other new severe symptoms.

## 2023-03-16 NOTE — ED Provider Notes (Signed)
[place] EMERGENCY DEPARTMENT AT [facility] Provider Note   CSN: [id number] Arrival date & time: 03/16/23  1933     History  Chief Complaint  Patient presents with   Hip Pain    Gail Johnson is a 32 y.o. female who presents with concern for left hip pain x 24 hours radiates down the front of the left hip.  Patient with history of MCL injury on the same knee but no history of hip pain.  States that she is a Engineer, civil (consulting) in a nursing facility.  Denies any recent heavy lifting.  States that pain is worse with movements, intermittent numbness and shooting pain down the front of the leg without numbness tingling or weakness in the lower leg.  No difficulty ambulating no pain is exacerbated with ambulation.  No noticeable rashes.  No recent trauma no recent spinal intervention or immunocompromising condition.  I reviewed her medical records.    She has used Voltaren gel without relief.  No oral medications taken.  Of note patient was previously administered Genvoya following blood-borne exposure via needlestick in her workplace but does not have diagnosis of HIV.  HPI     Home Medications Prior to Admission medications   Medication Sig Start Date End Date Taking? Authorizing Provider  methocarbamol (ROBAXIN) 500 MG tablet Take 1 tablet (500 mg total) by mouth every 8 (eight) hours as needed for muscle spasms. 03/16/23  Yes Idalee Foxworthy, Eugene Gavia, PA-C  aspirin-acetaminophen-caffeine (EXCEDRIN MIGRAINE) [phone] MG tablet Take 2 tablets by mouth daily as needed for headache or migraine.    [provider]  Calcium Carbonate Antacid (TUMS PO) Take 1 tablet by mouth daily as needed (heartburn).    [provider]  elvitegravir-cobicistat-emtricitabine-tenofovir (GENVOYA) 150-150-200-10 MG TABS tablet Take 1 tablet by mouth daily with breakfast. 06/23/22   Alfredo Martinez, MD  elvitegravir-cobicistat-emtricitabine-tenofovir (GENVOYA) 150-150-200-10 MG TABS tablet Take 1  tablet by mouth daily with breakfast. 06/23/22   Alfredo Martinez, MD  EPINEPHrine (EPIPEN 2-PAK) 0.3 mg/0.3 mL IJ SOAJ injection Inject 0.3 mg into the muscle as needed for anaphylaxis. Patient not taking: Reported on 03/29/2022 03/25/22   Birder Robson, MD  famotidine (PEPCID) 40 MG tablet Take 1 tablet (40 mg total) by mouth 2 (two) times daily. 03/25/22   Birder Robson, MD  fexofenadine [facility] ALLERGY) 180 MG tablet Take 2 tablets (360 mg total) by mouth in the morning and at bedtime. 03/25/22   Birder Robson, MD  hydrOXYzine (ATARAX) 10 MG tablet TAKE 1 TABLET (10 MG TOTAL) BY MOUTH AT BEDTIME AS NEEDED FOR ITCHING 04/18/22   Birder Robson, MD  JUNEL 1/20 1-20 MG-MCG tablet Take 1 tablet by mouth daily. 07/20/21   [provider]  nortriptyline (PAMELOR) 25 MG capsule TAKE 1 CAPSULE BY MOUTH EVERYDAY AT BEDTIME Patient taking differently: Take 25 mg by mouth daily as needed (migraines). 01/25/22   Drema Dallas, DO  omalizumab Geoffry Paradise) 150 MG/ML prefilled syringe Inject 300 mg into the skin every 28 (twenty-eight) days. 08/26/22   Birder Robson, MD      Allergies    Patient has no known allergies.    Review of Systems   Review of Systems  Musculoskeletal:        Left hip pain    Physical Exam Updated Vital Signs BP 125/87 (BP Location: Left Arm)   Pulse 85   Temp 98 F (36.7 C) (Oral)   Resp 16   Ht 5\' 6"  (  1.676 m)   Wt 72.6 kg   LMP 02/23/2023 (Exact Date)   SpO2 98%   BMI 25.82 kg/m  Physical Exam Vitals and nursing note reviewed.  Constitutional:      Appearance: She is not ill-appearing or toxic-appearing.  HENT:     Head: Normocephalic and atraumatic.     Mouth/Throat:     Mouth: Mucous membranes are moist.     Pharynx: No oropharyngeal exudate or posterior oropharyngeal erythema.  Eyes:     General:        Right eye: No discharge.        Left eye: No discharge.     Conjunctiva/sclera: Conjunctivae normal.  Cardiovascular:     Rate and Rhythm:  Normal rate and regular rhythm.     Pulses: Normal pulses.  Pulmonary:     Effort: Pulmonary effort is normal. No respiratory distress.     Breath sounds: Normal breath sounds. No wheezing or rales.  Abdominal:     General: Bowel sounds are normal. There is no distension.     Tenderness: There is no abdominal tenderness.  Musculoskeletal:        General: No deformity.     Cervical back: Normal and neck supple. No bony tenderness.     Thoracic back: Normal. No bony tenderness.     Lumbar back: Normal. No bony tenderness.       Back:     Right hip: Normal.     Left hip: Tenderness present. No bony tenderness. Normal range of motion.     Right upper leg: Normal.     Left upper leg: Normal.     Right knee: Normal.     Left knee: Normal.     Right lower leg: Normal.     Left lower leg: Normal.     Right ankle: Normal.     Right Achilles Tendon: Normal.     Left ankle: Normal.     Left Achilles Tendon: Normal.     Right foot: Normal.     Left foot: Normal.  Skin:    General: Skin is warm and dry.  Neurological:     Mental Status: She is alert. Mental status is at baseline.     Sensory: Sensation is intact.     Motor: Motor function is intact.     Gait: Gait is intact.  Psychiatric:        Mood and Affect: Mood normal.     ED Results / Procedures / Treatments   Labs (all labs ordered are listed, but only abnormal results are displayed) Labs Reviewed - No data to display  EKG None  Radiology DG Hip Unilat With Pelvis 2-3 Views Left  Result Date: 03/16/2023 CLINICAL DATA:  Pain, no known injury. EXAM: DG HIP (WITH OR WITHOUT PELVIS) 2-3V LEFT COMPARISON:  None Available. FINDINGS: No acute fracture of the pelvis or left hip. The hip joint spaces preserved. No erosion or avascular necrosis. No evidence of focal bone lesion. Pubic symphysis and sacroiliac joints are congruent. Occasional pelvic phleboliths IMPRESSION: Negative radiographs of the pelvis and left hip.  Electronically Signed   By: Narda Rutherford M.D.   On: 03/16/2023 22:50    Procedures Procedures    Medications Ordered in ED Medications  lidocaine (LIDODERM) 5 % 1 patch (has no administration in time range)  methocarbamol (ROBAXIN) tablet 500 mg (has no administration in time range)  acetaminophen (TYLENOL) tablet 650 mg (has no administration in time range)  ketorolac (  TORADOL) 15 MG/ML injection 15 mg (has no administration in time range)    ED Course/ Medical Decision Making/ A&P                                 Medical Decision Making 32 year old female presents with concern for left hip pain that radiates down the leg.  Vital signs are normal on intake.  Neurovascular exam is normal in the lower leg.  No midline tenderness palpation of the spine though there is degeneration of the left buttock.  No straight leg raise testing.  No rashes on skin examination.  Amount and/or Complexity of Data Reviewed Radiology: ordered.    Details: Hip xray negative  Risk OTC drugs. Prescription drug management.   No red flag symptoms or signs on exam of this patient's low back and hip pain.  Will offer oral analgesia, lidocaine patch, and muscle relaxer.  No radicular symptoms therefore norsteroids offered.  Clinical picture most consistent with sciatica.  Clinical concern for emergent underlying etiology such as cauda equina syndrome that would warrant further ED workup or inpatient management is exceedingly low.    [place]  voiced understanding of her medical evaluation and treatment plan. Each of their questions answered to their expressed satisfaction.  Return precautions were given.  Patient is well-appearing, stable, and was discharged in good condition.  This chart was dictated using voice recognition software, Dragon. Despite the best efforts of this provider to proofread and correct errors, errors may still occur which can change documentation meaning.       Final  Clinical Impression(s) / ED Diagnoses Final diagnoses:  Left hip pain    Rx / DC Orders ED Discharge Orders          Ordered    methocarbamol (ROBAXIN) 500 MG tablet  Every 8 hours PRN        03/16/23 2323              Devora Tortorella, Eugene Gavia, PA-C 03/16/23 2337    Charlynne Pander, MD 03/20/23 [phone]

## 2023-05-23 ENCOUNTER — Ambulatory Visit (INDEPENDENT_AMBULATORY_CARE_PROVIDER_SITE_OTHER): Payer: Managed Care, Other (non HMO) | Admitting: Podiatry

## 2023-05-23 ENCOUNTER — Encounter: Payer: Self-pay | Admitting: Podiatry

## 2023-05-23 VITALS — Ht 66.0 in | Wt 160.0 lb

## 2023-05-23 DIAGNOSIS — B351 Tinea unguium: Secondary | ICD-10-CM

## 2023-05-23 MED ORDER — TERBINAFINE HCL 250 MG PO TABS: 250.0000 mg | ORAL_TABLET | Freq: Every day | ORAL | 0 refills | Status: AC

## 2023-05-23 NOTE — Patient Instructions (Signed)
 Before your next visit, remove your nail polish so we can re-evaluate the nail

## 2023-05-25 NOTE — Progress Notes (Signed)
  Subjective:  Patient ID: Gail Johnson, female    DOB: 12-Oct-1990,  MRN: [id number]  Chief Complaint  Patient presents with   Nail Problem    She reports she get pain with the big toes and has been getting acrylic gel on the toes and has some burning on the big toes of both feet, she is not sure if its ingrown,     33 y.o. female presents with the above complaint. History confirmed with patient.   Objective:  Physical Exam: warm, good capillary refill, no trophic changes or ulcerative lesions, normal DP and PT pulses, normal sensory exam, and no paronychia she does have dystrophy and discoloration both great toenails and onychomycosis.  Assessment:   1. Onychomycosis      Plan:  Patient was evaluated and treated and all questions answered.  Onychomycosis -Educated on etiology of nail fungus. -Discussed oral topical and laser therapy and risks and benefits of each -eRx for oral terbinafine  #90. Educated on risks and benefits of the medication. -Advised to remove nail polish before next visit -Advised to avoid acrylic I think the thickness of the acrylic she is getting is causing the dystrophy  Return in about 14 weeks (around 08/29/2023) for follow up after nail fungus treatment.

## 2023-05-29 ENCOUNTER — Telehealth: Payer: Self-pay | Admitting: *Deleted

## 2023-05-29 NOTE — Telephone Encounter (Signed)
Patient called inquiring about her Xolair stating that she called last Thursday and spoke with someone and they stated to contact the pharmacy, the pharmacy stated that they had sent a prescription last year around 12/2022. I advised that per the contract that is signed for starting Biolog[employer] after 12 weeks of being inactive the medication can be moved to samples and become property of [facility]. Patient verbalized understanding. She has been scheduled to see Thurston Hole next Thursday 06/08/23 for Xolair Re-approval.

## 2023-06-02 ENCOUNTER — Telehealth: Payer: Self-pay | Admitting: *Deleted

## 2023-06-02 NOTE — Telephone Encounter (Signed)
-----   Message from [facility] Lachelle S sent at 05/26/2023  8:57 AM EST ----- Regarding: restart Xolair Gail Johnson called wanting to restart her Xolair injections, her last injectio was  11/25/22. There is not an injection here in the office for her. I informed her you would reach out to her.

## 2023-06-02 NOTE — Telephone Encounter (Signed)
T/c to patient advised appt needed to restart Xolair not seen in 2 years

## 2023-06-08 ENCOUNTER — Ambulatory Visit: Payer: Managed Care, Other (non HMO) | Admitting: Family Medicine

## 2023-08-11 ENCOUNTER — Other Ambulatory Visit: Payer: Self-pay | Admitting: Nurse Practitioner

## 2023-08-11 DIAGNOSIS — N644 Mastodynia: Secondary | ICD-10-CM

## 2023-08-16 IMAGING — MR MR MRA HEAD W/O CM
1 series · 23 of 48 positions shown · non-contrast
Comparison: None Available.

CLINICAL DATA: Family history of cerebral aneurysm. Migraine
headaches. Numbness. Numbness and tingling of the right side of the
face.

EXAM:
MRI HEAD WITHOUT CONTRAST
MRA HEAD WITHOUT CONTRAST
TECHNIQUE: Multiplanar, multi-echo pulse sequences of the brain and surrounding
structures were acquired without intravenous contrast. Angiographic
images of the Circle of Willis were acquired using MRA technique
without intravenous contrast.

[Series 3: tof_3d_multi-slab · axial · 0.7mm · 0.35mm/px · z∈[-71,+40]mm · 23 of 169 slices shown]
[im 1/169]
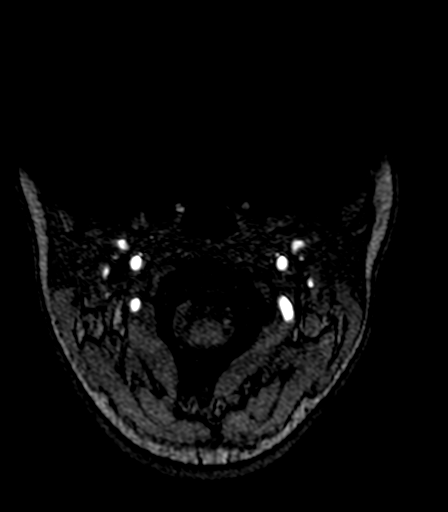
[im 4/169]
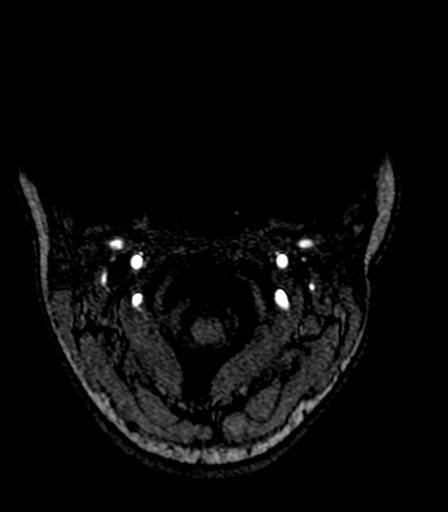
[im 8/169]
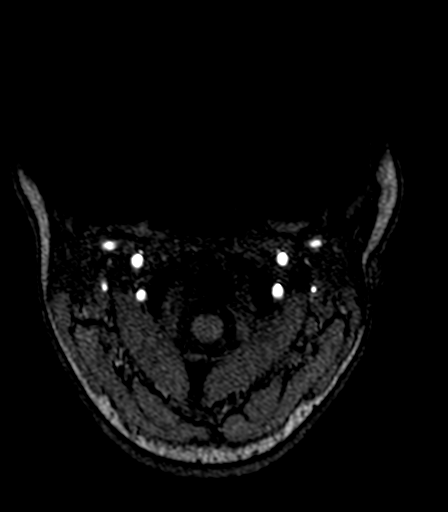
[im 11/169]
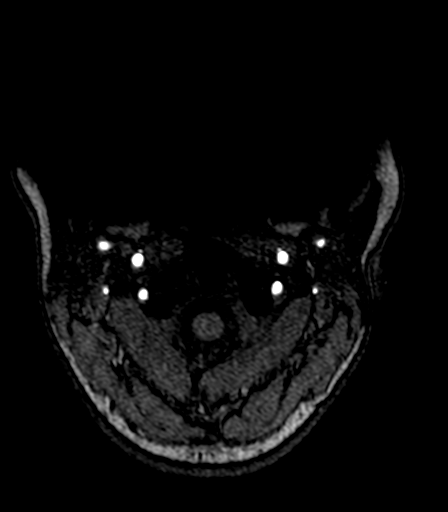
[im 15/169]
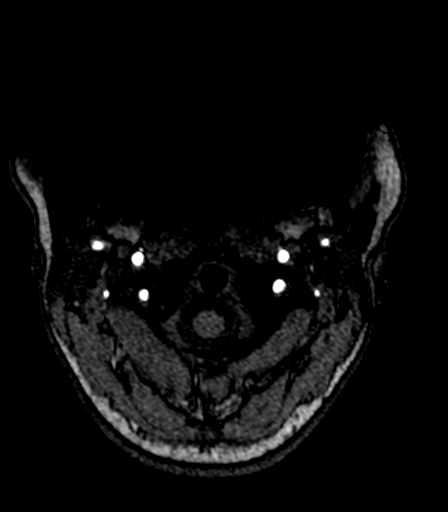
[im 18/169]
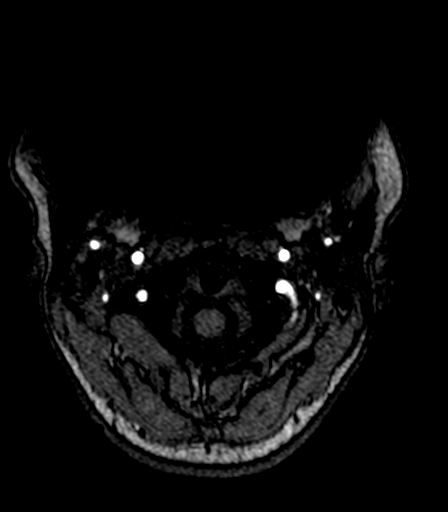
[im 22/169]
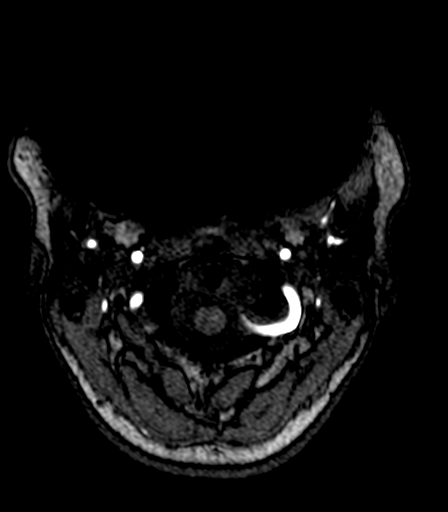
[im 26/169]
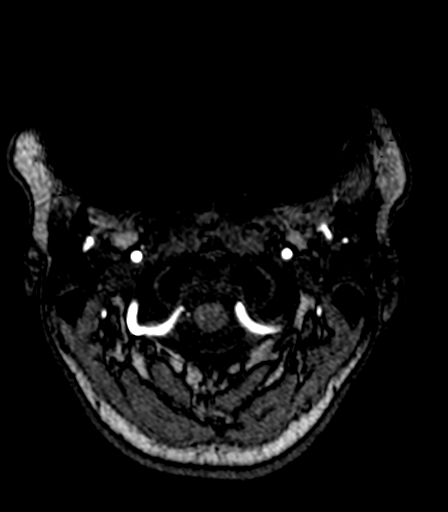
[im 29/169]
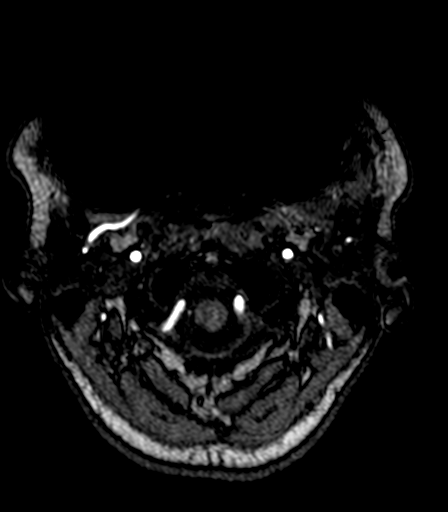
[im 33/169]
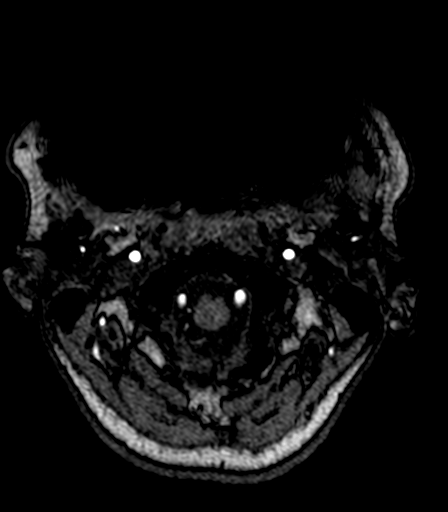
[im 36/169]
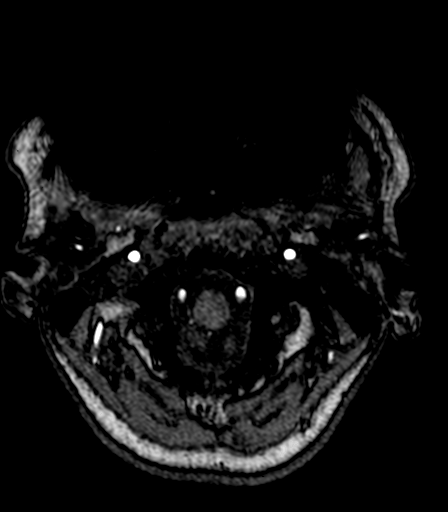
[im 40/169]
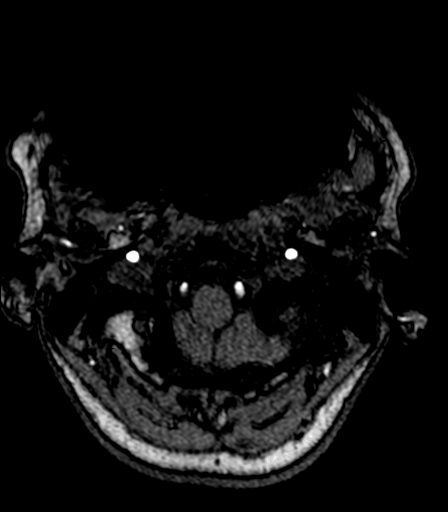
[im 43/169]
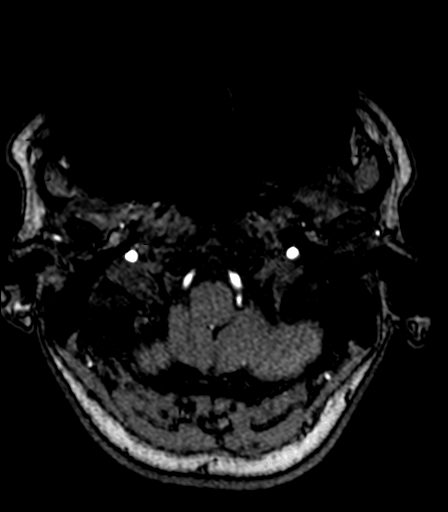
[im 47/169]
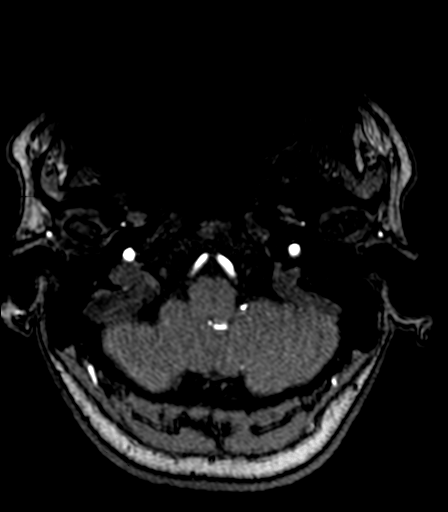
[im 51/169]
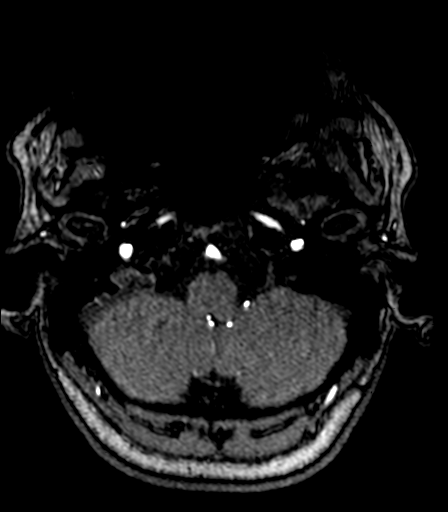
[im 54/169]
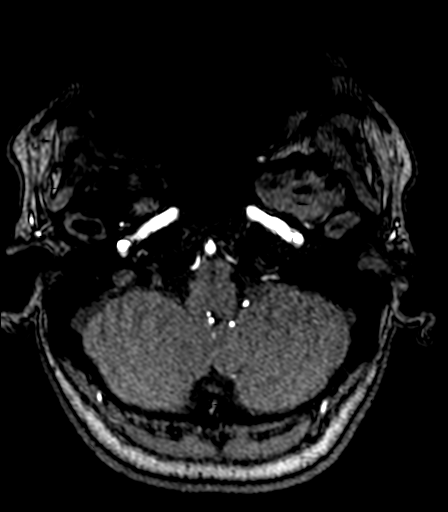
[im 76/169]
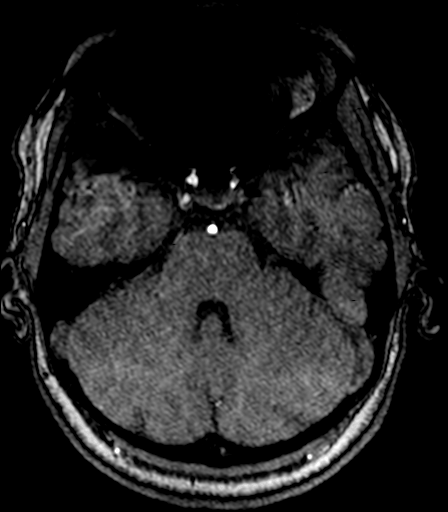
[im 86/169]
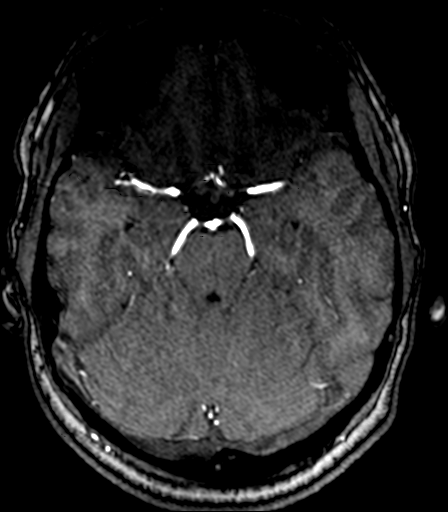
[im 97/169]
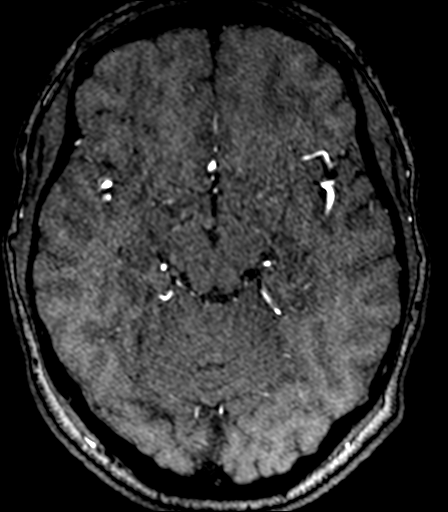
[im 118/169]
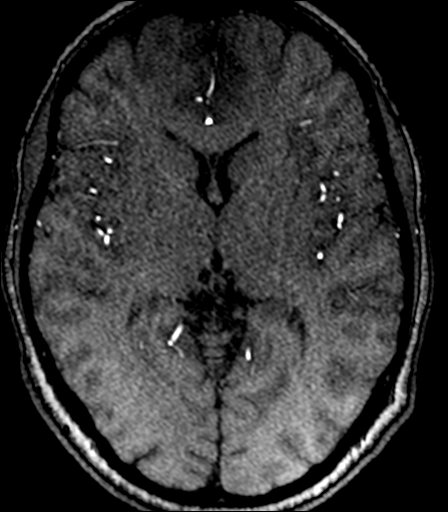
[im 140/169]
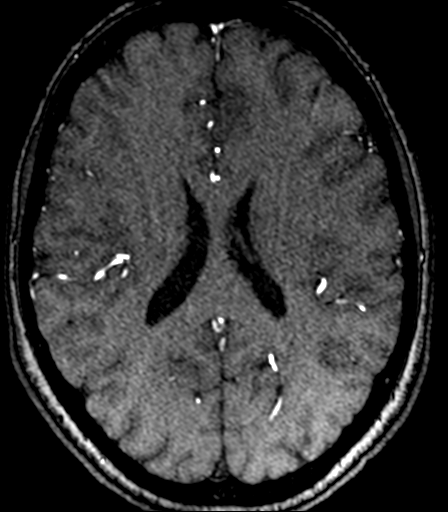
[im 143/169]
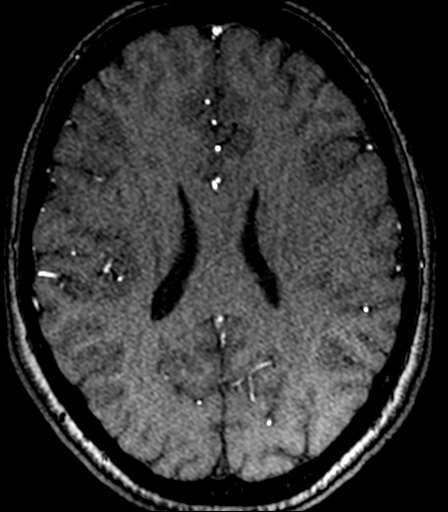
[im 161/169]
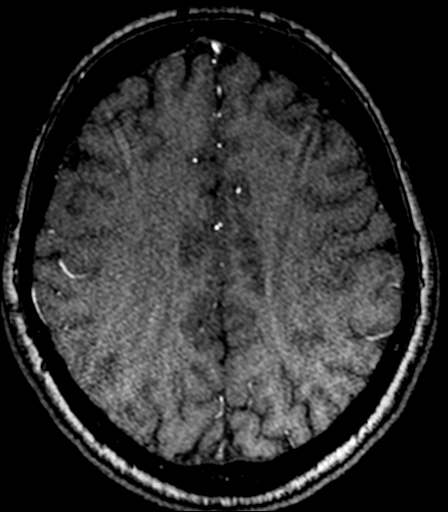

[23 of 48 positions shown; findings below may reference images not displayed]

FINDINGS: MRI HEAD FINDINGS

Brain: The brain has a normal appearance without evidence of
malformation, atrophy, old or acute small or large vessel
infarction, mass lesion, hemorrhage, hydrocephalus or extra-axial
collection.

Vascular: Major vessels at the base of the brain show flow. Venous
sinuses appear patent.

Skull and upper cervical spine: Normal.

Sinuses/Orbits: Clear/normal.

Other: None significant.

MRA HEAD FINDINGS

Both internal carotid arteries are widely patent into the brain. No
siphon stenosis. The anterior and middle cerebral vessels are patent
without proximal stenosis, aneurysm or vascular malformation.

Both vertebral arteries are widely patent to the basilar. No basilar
stenosis. Posterior circulation branch vessels appear normal.
IMPRESSION: Normal MRI of the brain. No abnormality seen to explain headache or
numbness.

Normal intracranial MR angiography. No aneurysm. No vascular
malformation.

## 2023-08-22 ENCOUNTER — Ambulatory Visit
Admission: RE | Admit: 2023-08-22 | Discharge: 2023-08-22 | Disposition: A | Discharge status: Home / Self Care | Source: Ambulatory Visit | Attending: Nurse Practitioner

## 2023-08-22 DIAGNOSIS — N644 Mastodynia: Secondary | ICD-10-CM

## 2023-08-29 ENCOUNTER — Ambulatory Visit: Payer: Managed Care, Other (non HMO) | Admitting: Podiatry

## 2023-09-09 ENCOUNTER — Other Ambulatory Visit: Payer: Self-pay | Admitting: Internal Medicine

## 2023-09-11 NOTE — Patient Instructions (Incomplete)
 Idiopathic Urticaria (Hives): Stop Xyzal - Start Allegra  180 mg taking 1 to 2 tablets twice daily as needed for itching and Pepcid  40mg  twice daily as needed for itching. - Can take hydroxyzine  10-30mg  nightly as needed for itching/hives.  Can cause sedation so use at nighttime - Will send a message to [name], our biologics coordinator about getting Xolair  300mg  every 4 weeks for uncontrolled urticaria approved.  Bring Epipen  for all Xolair  shot visit`s.  She would like to do injections at home after receiving first 3 injections consecutively at our office.   Follow up in 3 months or sooner if needed

## 2023-09-12 ENCOUNTER — Encounter: Payer: Self-pay | Admitting: Family

## 2023-09-12 ENCOUNTER — Ambulatory Visit (INDEPENDENT_AMBULATORY_CARE_PROVIDER_SITE_OTHER): Admitting: Family

## 2023-09-12 ENCOUNTER — Other Ambulatory Visit: Payer: Self-pay

## 2023-09-12 VITALS — BP 110/70 | HR 91 | Temp 98.3°F | Ht 66.0 in | Wt 165.0 lb

## 2023-09-12 DIAGNOSIS — L501 Idiopathic urticaria: Secondary | ICD-10-CM | POA: Diagnosis not present

## 2023-09-12 MED ORDER — HYDROXYZINE HCL 10 MG PO TABS: 10.0000 mg | ORAL_TABLET | Freq: Every evening | ORAL | 0 refills | Status: DC | PRN

## 2023-09-12 MED ORDER — EPINEPHRINE 0.3 MG/0.3ML IJ SOAJ: 0.3000 mg | INTRAMUSCULAR | 1 refills | Status: DC | PRN

## 2023-09-12 MED ORDER — FEXOFENADINE HCL 180 MG PO TABS: ORAL_TABLET | ORAL | 2 refills | Status: DC

## 2023-09-12 NOTE — Progress Notes (Signed)
 [address] [place] [address] Dept: [phone]  FOLLOW UP NOTE  Patient ID: Gail Johnson, female    DOB: [birth year]  Age: 33 y.o. MRN: [id number] Date of Office Visit: 09/12/2023  Assessment  Chief Complaint: Follow-up (Xolair   renewal )  HPI Gail Johnson is a 33 year old female who presents today for follow-up of idiopathic urticaria and angioedema.  She was last seen on March 25, 2022 by Dr. Lydia Sams.  She denies any new diagnosis or surgery since her last office visit.  Chronic urticaria: She reports that she has been self-medicating since being off Xolair .  Her last Xolair  injection was on November 25, 2022.  Prior to receiving Xolair  injections with us  she was on Xolair  injections with [facility] .  She feels like she started Xolair  injections sometime before COVID-19.  She is currently taking Xyzal 2 tablets in the morning Allegra  once a day, and famotidine  40 mg in the morning.  She has also had prednisone ,but she is currently out of.  She would like to restart Xolair .  When she was on Xolair  she reports that her hives were controlled.  Prednisone  helps also with her hives, but she does not like to be on prednisone  due to the side effects.  She denies any angioedema with her hives.  She also denies any concomitant cardiorespiratory or gastrointestinal symptoms.  She reports that her epinephrine  autoinjector device is out of date.  She would like to start getting her Xolair  injections at home after getting her first 3 injections at our office.  She reports that she works from 8-5 and it is hard to come during our office hours.   Drug Allergies:  No Known Allergies  Review of Systems: Negative except as per HPI   Physical Exam: BP (!) 100/56   Pulse 91   Temp 98.3 F (36.8 C)   Ht 5\' 6"  (1.676 m)   Wt 165 lb (74.8 kg)   SpO2 99%   BMI 26.63 kg/m    Physical Exam Constitutional:      Appearance: Normal appearance.  HENT:     Head: Normocephalic and  atraumatic.     Comments: Pharynx normal, eyes normal, ears normal, nose normal    Right Ear: Tympanic membrane, ear canal and external ear normal.     Left Ear: Tympanic membrane, ear canal and external ear normal.     Nose: Nose normal.     Mouth/Throat:     Mouth: Mucous membranes are moist.     Pharynx: Oropharynx is clear.  Eyes:     Conjunctiva/sclera: Conjunctivae normal.  Cardiovascular:     Rate and Rhythm: Regular rhythm.     Heart sounds: Normal heart sounds.  Pulmonary:     Effort: Pulmonary effort is normal.     Breath sounds: Normal breath sounds.     Comments: Lungs clear to auscultation Musculoskeletal:     Cervical back: Neck supple.  Skin:    General: Skin is warm.     Comments: Urticarial lesion noted on left upper back region  Neurological:     Mental Status: She is alert and oriented to person, place, and time.  Psychiatric:        Mood and Affect: Mood normal.        Behavior: Behavior normal.        Thought Content: Thought content normal.        Judgment: Judgment normal.     Diagnostics: None  Assessment and Plan:  1. Idiopathic urticaria     Meds ordered this encounter  Medications   fexofenadine  (ALLEGRA  ALLERGY ) 180 MG tablet    Sig: Take 1 to 2 tablets twice a day as needed for itching.  Caution as may make you sleepy    Dispense:  120 tablet    Refill:  2   hydrOXYzine  (ATARAX ) 10 MG tablet    Sig: Take 1 tablet (10 mg total) by mouth at bedtime as needed for itching.    Dispense:  30 tablet    Refill:  0   EPINEPHrine  (EPIPEN  2-PAK) 0.3 mg/0.3 mL IJ SOAJ injection    Sig: Inject 0.3 mg into the muscle as needed for anaphylaxis.    Dispense:  2 each    Refill:  1    Patient Instructions  Idiopathic Urticaria (Hives): Stop Xyzal - Start Allegra  180 mg taking 1 to 2 tablets twice daily as needed for itching and Pepcid  40mg  twice daily as needed for itching. - Can take hydroxyzine  10-30mg  nightly as needed for itching/hives.  Can  cause sedation so use at nighttime - Will send a message to [name], our biologics coordinator about getting Xolair  300mg  every 4 weeks for uncontrolled urticaria approved.  Bring Epipen  for all Xolair  shot visit`s.  She would like to do injections at home after receiving first 3 injections consecutively at our office.   Follow up in 3 months or sooner if needed   Return in about 3 months (around 12/13/2023), or if symptoms worsen or fail to improve.    Thank you for the opportunity to care for this patient.  Please do not hesitate to contact me with questions.  Tinnie Forehand, FNP [facility] 

## 2023-09-13 ENCOUNTER — Telehealth: Payer: Self-pay | Admitting: *Deleted

## 2023-09-13 NOTE — Telephone Encounter (Signed)
-----   Message from Tinnie Forehand sent at 09/12/2023 11:58 AM EDT ----- Gail Johnson would like to restart Xolair  300 mg every 4 weeks for idiopathic urticaria. She would like to start doing injections at home after getting the first 3 injections at our office.

## 2023-09-13 NOTE — Telephone Encounter (Signed)
 L/m for patient to advise approval for xolair  and resubmit to [facility]

## 2023-09-14 NOTE — Telephone Encounter (Signed)
 Thanks [employer]

## 2023-09-14 NOTE — Telephone Encounter (Signed)
 Spoke to patient and advised approval and submit to [facility] for xolair . Will reach out once delivery set to make appt to start therapy

## 2023-09-19 ENCOUNTER — Telehealth: Payer: Self-pay | Admitting: *Deleted

## 2023-09-19 ENCOUNTER — Ambulatory Visit

## 2023-09-19 DIAGNOSIS — L501 Idiopathic urticaria: Secondary | ICD-10-CM | POA: Diagnosis not present

## 2023-09-19 NOTE — Progress Notes (Signed)
 Immunotherapy   Patient Details  Name: Gail Johnson MRN: [id number] Date of Birth: [birth year]  09/19/2023  [name] re-started injections for Hives. Patient received a 300 mg dose of Xolair  and waited an hour in office with no problems.  Frequency:every 28 days Epi-Pen:Epi-Pen Available  Consent signed and patient instructions given.   Denton Flakes 09/19/2023, 4:16 PM

## 2023-09-19 NOTE — Telephone Encounter (Signed)
 Thanks [name]!  Gail Johnson will you see if [name] cam come tomorrow at 3 PM for Xolair  restart? If she cannot come in tomorrow please let me know

## 2023-09-19 NOTE — Telephone Encounter (Signed)
 Called patient - DOB/NEED DPR  verified - LMOVM to contact office regarding below notation.  I will send a myChart messge as well.

## 2023-09-19 NOTE — Telephone Encounter (Signed)
 Patient called in regards to her hives she is currently having a flare-up and has hives on her face. She has been submitted for Xolair  to [facility] last week. She is either requesting samples of Xolair  or prednisone  to get her by. We do have plenty of samples in clinic but not sure how fast we can get her in to restart

## 2023-09-19 NOTE — Telephone Encounter (Signed)
 Thank you Moira Andrews please let me know what you find out!

## 2023-10-04 ENCOUNTER — Other Ambulatory Visit: Payer: Self-pay | Admitting: Family

## 2023-10-17 ENCOUNTER — Ambulatory Visit

## 2023-11-01 ENCOUNTER — Ambulatory Visit

## 2023-11-09 ENCOUNTER — Ambulatory Visit

## 2023-11-09 DIAGNOSIS — L501 Idiopathic urticaria: Secondary | ICD-10-CM | POA: Diagnosis not present

## 2023-12-08 ENCOUNTER — Ambulatory Visit

## 2023-12-19 ENCOUNTER — Ambulatory Visit: Admitting: Allergy & Immunology

## 2023-12-28 ENCOUNTER — Ambulatory Visit (INDEPENDENT_AMBULATORY_CARE_PROVIDER_SITE_OTHER)

## 2023-12-28 DIAGNOSIS — L501 Idiopathic urticaria: Secondary | ICD-10-CM

## 2023-12-28 NOTE — Progress Notes (Signed)
 Immunotherapy   Patient Details  Name: Gail Johnson MRN: [id number] Date of Birth: 1991-02-25  12/28/2023  Gail Johnson was shown how to self administer her Xolair . Patient will continue to self administer at home.   Gail Johnson 12/28/2023, 11:45 AM

## 2024-02-05 NOTE — Patient Instructions (Incomplete)
 Idiopathic Urticaria (Hives): - Continue Allegra  180 mg taking 1 to 2 tablets twice daily as needed for itching and Pepcid  40mg  twice daily as needed for itching. - Can take hydroxyzine  10-30mg  nightly as needed for itching/hives.  Can cause sedation so use at nighttime - Continue Xolair  300mg  every 4 weeks at home and have access to your epinephrine  auto injector device at all times  Follow up in months or sooner if needed

## 2024-02-06 ENCOUNTER — Ambulatory Visit: Admitting: Family

## 2024-02-21 ENCOUNTER — Ambulatory Visit (INDEPENDENT_AMBULATORY_CARE_PROVIDER_SITE_OTHER): Admitting: Internal Medicine

## 2024-02-21 ENCOUNTER — Other Ambulatory Visit: Payer: Self-pay

## 2024-02-21 ENCOUNTER — Encounter: Payer: Self-pay | Admitting: Internal Medicine

## 2024-02-21 VITALS — BP 112/68 | HR 88 | Temp 98.4°F | Wt 157.6 lb

## 2024-02-21 DIAGNOSIS — Z79899 Other long term (current) drug therapy: Secondary | ICD-10-CM

## 2024-02-21 DIAGNOSIS — L501 Idiopathic urticaria: Secondary | ICD-10-CM | POA: Diagnosis not present

## 2024-02-21 MED ORDER — FAMOTIDINE 40 MG PO TABS: 40.0000 mg | ORAL_TABLET | Freq: Two times a day (BID) | ORAL | 5 refills | Status: AC | PRN

## 2024-02-21 MED ORDER — FEXOFENADINE HCL 180 MG PO TABS: 180.0000 mg | ORAL_TABLET | Freq: Two times a day (BID) | ORAL | 5 refills | Status: AC | PRN

## 2024-02-21 MED ORDER — EPINEPHRINE 0.3 MG/0.3ML IJ SOAJ: 0.3000 mg | INTRAMUSCULAR | 1 refills | Status: AC | PRN

## 2024-02-21 MED ORDER — HYDROXYZINE HCL 10 MG PO TABS: 10.0000 mg | ORAL_TABLET | Freq: Every evening | ORAL | 1 refills | Status: AC | PRN

## 2024-02-21 NOTE — Patient Instructions (Addendum)
 Idiopathic Urticaria (Hives): - Continue Allegra  180 mg (1-2 tablets) twice daily as needed and Pepcid  40mg  (1 tablet) twice daily as needed for itching or hives.  - Can take hydroxyzine  10-30mg  nightly as needed for itching/hives.  Can cause sedation so use at nighttime - Restart Xolair  300mg  every 4 weeks at home and have access to your epinephrine  auto injector device at all times.

## 2024-02-21 NOTE — Progress Notes (Addendum)
   FOLLOW UP Date of Service/Encounter:  02/21/24   Subjective:  Gail Johnson (DOB: 1991-05-05) is a 33 y.o. female who [facility] for follow up for idiopathic urticaria.   History obtained from: chart review and patient. Last seen by Wanda Craze 09/12/2023 and at the time, was having frequent hives requiring prednisone , discussed re initiation of Xolair .  Got 3 shots in our clinic and was started on home injections with Rx of Epipen .   Reports having outbreaks of hives since last visit. Last one was on Sunday and required use of Xyzal and Allegra  to help calm.  Usually with Xolair , she is well controlled and rarely needs anti histamines but without it, she flares frequently and needs high dose anti histamines and frequent oral prednisone .  Last dose of Xolair  was on 8/21 and she never started the home administration.   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  We discussed the option of late clinic on T and Th.   Past Medical History: Past Medical History:  Diagnosis Date   Anemia    Anxiety    BV (bacterial vaginosis) 07/04/2014   Chlamydia    HSV-2 infection    Infection    Multiple allergies    gts hives   Urticaria    Vaginal discharge 07/04/2014    Objective:  BP 112/68 (BP Location: Right Arm, Patient Position: Sitting, Cuff Size: Normal)   Pulse 88   Temp 98.4 F (36.9 C) (Temporal)   Wt 157 lb 9.6 oz (71.5 kg)   SpO2 100%   BMI 25.44 kg/m  Body mass index is 25.44 kg/m. Physical Exam: GEN: alert, well developed HEENT: clear conjunctiva, nose without rhinorrhea  HEART: regular rate and rhythm, no murmur LUNGS: clear to auscultation bilaterally, no coughing, unlabored respiration SKIN: no rashes or lesions  Assessment:   1. Idiopathic urticaria   2. High risk medication use     Plan/Recommendations:  Idiopathic Urticaria  (Hives): - Uncontrolled, restart Xolair .  - Continue Allegra  180 mg (1-2 tablets) twice daily as needed and Pepcid  40mg  (1 tablet) twice daily as needed for itching or hives.  - Can take hydroxyzine  10-30mg  nightly as needed for itching/hives.  Can cause sedation so use at nighttime - Restart Xolair  300mg  every 4 weeks at home and have access to your epinephrine  auto injector device at all times.      Return in about 6 months (around 08/21/2024).  Arleta Blanch, MD [facility]

## 2024-02-26 ENCOUNTER — Telehealth: Payer: Self-pay | Admitting: *Deleted

## 2024-02-26 NOTE — Telephone Encounter (Signed)
-----   Message from Arleta SHAUNNA Blanch sent at 02/21/2024 10:40 AM EDT ----- Walterine milling  Would like for her to start Xolair  at home.  She got her 3 injections in clinic but then seems like no showed due to work and never started the home injections.  Has been flaring up since then despite use of high dose anti histamines.  Thank you.

## 2024-02-26 NOTE — Telephone Encounter (Signed)
 I reached out to patient and advised will fax attestation to [facility] and she should reach out to them in the next couple days to reorder her Xolair  to home. Delivery and storage instructions given to patient
